# Patient Record
Sex: Female | Born: 1990 | Race: White | Hispanic: No | Marital: Married | Smoking: Never smoker
Health system: Southern US, Community
[De-identification: ages and names within clinical notes are randomized; demographics above are authoritative.]

## PROBLEM LIST (undated history)

## (undated) ENCOUNTER — Inpatient Hospital Stay (HOSPITAL_COMMUNITY): Payer: Self-pay

## (undated) DIAGNOSIS — R569 Unspecified convulsions: Secondary | ICD-10-CM

## (undated) DIAGNOSIS — D649 Anemia, unspecified: Secondary | ICD-10-CM

## (undated) DIAGNOSIS — K219 Gastro-esophageal reflux disease without esophagitis: Secondary | ICD-10-CM

## (undated) HISTORY — PX: WISDOM TOOTH EXTRACTION: SHX21

---
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
## 2011-07-13 ENCOUNTER — Other Ambulatory Visit: Payer: Self-pay | Admitting: Family Medicine

## 2011-07-13 DIAGNOSIS — R109 Unspecified abdominal pain: Secondary | ICD-10-CM

## 2011-07-21 ENCOUNTER — Ambulatory Visit
Admission: RE | Admit: 2011-07-21 | Discharge: 2011-07-21 | Disposition: A | Discharge status: Home / Self Care | Payer: 59 | Source: Ambulatory Visit | Attending: Family Medicine | Admitting: Family Medicine

## 2011-07-21 DIAGNOSIS — R109 Unspecified abdominal pain: Secondary | ICD-10-CM

## 2013-06-17 ENCOUNTER — Telehealth: Payer: Self-pay | Admitting: Neurology

## 2013-06-17 NOTE — Telephone Encounter (Signed)
I called patient. The patient was last seen to this office in 2012, with the last seizure in 2008. The patient was tapered off of her seizure medications, and she has done well. The patient is [redacted] weeks pregnant, and she is doing well. I would not alter any of her therapy at this point, the patient is to contact me only if she has a seizure event.

## 2013-07-18 ENCOUNTER — Inpatient Hospital Stay (HOSPITAL_COMMUNITY): Admission: AD | Admit: 2013-07-18 | Payer: Self-pay | Source: Ambulatory Visit | Admitting: Obstetrics and Gynecology

## 2014-04-08 ENCOUNTER — Other Ambulatory Visit: Payer: Self-pay | Admitting: Obstetrics and Gynecology

## 2014-04-08 DIAGNOSIS — N83201 Unspecified ovarian cyst, right side: Secondary | ICD-10-CM

## 2014-04-15 ENCOUNTER — Ambulatory Visit
Admission: RE | Admit: 2014-04-15 | Discharge: 2014-04-15 | Disposition: A | Discharge status: Home / Self Care | Payer: Commercial Managed Care - PPO | Source: Ambulatory Visit | Attending: Obstetrics and Gynecology | Admitting: Obstetrics and Gynecology

## 2014-04-15 DIAGNOSIS — N83201 Unspecified ovarian cyst, right side: Secondary | ICD-10-CM

## 2014-04-15 MED ORDER — GADOBENATE DIMEGLUMINE 529 MG/ML IV SOLN
10.0000 mL | Freq: Once | INTRAVENOUS | Status: AC | PRN
Start: 2014-04-15 — End: 2014-04-15

## 2014-06-06 ENCOUNTER — Other Ambulatory Visit: Payer: Self-pay | Admitting: Obstetrics and Gynecology

## 2014-06-18 ENCOUNTER — Other Ambulatory Visit: Payer: Self-pay | Admitting: Obstetrics and Gynecology

## 2014-06-21 ENCOUNTER — Other Ambulatory Visit (HOSPITAL_COMMUNITY): Payer: Self-pay | Admitting: Obstetrics and Gynecology

## 2014-06-21 NOTE — H&P (Signed)
Jennifer Knox is a 23 y.o.  female P 1-0-0-1 for removal of peritoneal mass.  During pregnancy in October  2014, the patient was found on ultrasound to have an asymptomatic right  pelvic mass (originally thought to be a cyst) that measured 9.8 x 6.8 x 10.0 cm with positive arterial & venous flow.  In May 2015 the mass had decreased to 6.68 x. 8.24 x 6.08 cm.  Patient remained asymptomatic and  due to breastfeeding she had occasional menses lasting for 5 days with a pad change every 3 hours.  Her cramping with periods is rated at 7/10 on a 10 point pain scale but she finds relief with Midol.  She goes on to deny any dyspareunia, changes in bowel or bladder function or vaginitis symptoms.  In August 2015 the patient had a normal CA-125 with a value of 10 and a  follow up pelvic ultrasound showed virtually no change in the pelvic mass.  A follow up MRI in September 2015 described the pelvic mass as being in the posterior right pelvis, well circumscribed with angulated margins measuring 7.9 x 7.6 x 8.2 cm without septations or nodular wall thickening.  Specifically the mass is located superior to the cervix, right of rectum and posterior to the  right ovary.  Given the persistent nature of this pelvic mass the patient has decided to proceed with removal for a  definitive diagnosis and managment.   Past Medical History  OB History: G: 1;  P: 1-0-0-1  SVB 2015  GYN History: menarche: 23 YO    LMP: 06/12/2014;  Contracepton condoms  The patient denies history of sexually transmitted disease.  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 Ascorbic Acid (VITAMIN C) 1000 MG tablet Take 1,000 mg by mouth daily.  . Ferrous Gluconate (IRON) 240 (27 FE) MG TABS Take 1 tablet by mouth daily.  . folic acid (FOLVITE) 220 MCG tablet Take 400 mcg by mouth daily.  Marland Kitchen ibuprofen (ADVIL,MOTRIN) 200 MG tablet Take 200-400 mg by mouth every 6 (six) hours as needed for headache.  . pantoprazole (PROTONIX) 40 MG tablet Take 40 mg by mouth daily.    Allergies  Allergen Reactions  . Penicillins Hives    Denies sensitivity to peanuts, shellfish, soy, latex or adhesives.  ROS: Admits to wearing contact lenses but  denies headache, vision changes, nasal congestion, dysphagia, tinnitus, dizziness, hoarseness, cough,  chest pain, shortness of breath, nausea, vomiting, diarrhea,constipation,  urinary frequency, urgency  dysuria, hematuria, vaginitis symptoms, pelvic pain, swelling of joints,easy bruising,  myalgias, arthralgias, skin rashes, unexplained weight loss and except as is mentioned in the history of present illness, patient's review of systems is otherwise negative.    Physical Exam  Bp: 84/56    P: 80    R: 18   Temperature: 97.0 degrees F orally      Weight: 110 lbs.  Height: 5\' 7"    BMI: 17.2  Neck: supple without masses or thyromegaly Lungs: clear to auscultation Heart: regular rate and rhythm Abdomen: soft, non-tender and no organomegaly Pelvic:EGBUS- wnl; vagina-normal rugae; uterus-normal size, cervix without lesions or motion tenderness; adnexae-no  tenderness or masses Extremities:  no clubbing, cyanosis or edema   Assesment: Peritoneal Mass   Disposition:  A discussion was held with patient regarding the indication for her procedure(s) along with the risks, which include but are not limited to: reaction to anesthesia, damage to adjacent organs, infection and excessive bleeding.  The patient verbalized understanding of these risks and has consented to proceed with Laparoscopic Removal of a Peritoneal Mass  with a Possible Laparotomy at [place] on July 01, 2014 at 7:30 a.m.   CSN# [id number]   Alix Stowers J. Florene Glen, PA-C  for Dr. Seymour Bars. Haygood

## 2014-06-26 NOTE — Patient Instructions (Addendum)
   Your procedure is scheduled on: Tuesday, Dec 15  Enter through the [employer] [facility] at: 6 AM Pick up the phone at the desk and dial [phone] and inform us of your arrival.  Please call this number if you have any problems the morning of surgery: [phone]  Remember: Do not eat or drink after midnight: Monday Take these medicines the morning of surgery with a SIP OF WATER: None   Do not wear jewelry, make-up, or FINGER nail polish No metal in your hair or on your body. Do not wear lotions, powders, perfumes.  You may wear deodorant.  Do not bring valuables to the hospital. Contacts, dentures or bridgework may not be worn into surgery.  Patients discharged on the day of surgery will not be allowed to drive home.  Home with husband Matt cell [phone].  For patients being admitted to the hospital, checkout time is 11:00am the day of discharge.

## 2014-06-27 ENCOUNTER — Encounter (HOSPITAL_COMMUNITY)
Admission: RE | Admit: 2014-06-27 | Discharge: 2014-06-27 | Disposition: A | Discharge status: Home / Self Care | Payer: Commercial Managed Care - PPO | Source: Ambulatory Visit | Attending: Obstetrics and Gynecology | Admitting: Obstetrics and Gynecology

## 2014-06-27 ENCOUNTER — Encounter (HOSPITAL_COMMUNITY): Payer: Self-pay

## 2014-06-27 DIAGNOSIS — Z01812 Encounter for preprocedural laboratory examination: Secondary | ICD-10-CM | POA: Diagnosis not present

## 2014-06-27 DIAGNOSIS — R1900 Intra-abdominal and pelvic swelling, mass and lump, unspecified site: Secondary | ICD-10-CM | POA: Diagnosis not present

## 2014-06-27 HISTORY — DX: Unspecified convulsions: R56.9

## 2014-06-27 HISTORY — DX: Anemia, unspecified: D64.9

## 2014-06-27 HISTORY — DX: Gastro-esophageal reflux disease without esophagitis: K21.9

## 2014-06-27 LAB — CBC
HEMATOCRIT: 38.6 % (ref 36.0–46.0)
Hemoglobin: 12.9 g/dL (ref 12.0–15.0)
MCH: 29.7 pg (ref 26.0–34.0)
MCHC: 33.4 g/dL (ref 30.0–36.0)
MCV: 88.9 fL (ref 78.0–100.0)
Platelets: 202 10*3/uL (ref 150–400)
RBC: 4.34 MIL/uL (ref 3.87–5.11)
RDW: 13.4 % (ref 11.5–15.5)
WBC: 7.8 10*3/uL (ref 4.0–10.5)

## 2014-06-30 ENCOUNTER — Encounter (HOSPITAL_COMMUNITY): Payer: Self-pay | Admitting: Anesthesiology

## 2014-06-30 NOTE — Anesthesia Preprocedure Evaluation (Addendum)
Anesthesia Evaluation  Patient identified by MRN, date of birth, ID band Patient awake    Reviewed: Allergy & Precautions, H&P , NPO status , Patient's Chart, lab work & pertinent test results  Airway Mallampati: II  TM Distance: >3 FB Neck ROM: Full    Dental  (+) Teeth Intact   Pulmonary neg pulmonary ROS,  breath sounds clear to auscultation  Pulmonary exam normal       Cardiovascular negative cardio ROS  Rhythm:Regular Rate:Normal     Neuro/Psych Seizures -, Well Controlled,  Last Sz 2008 Off Rx since 2012- used to take Zonegram negative psych ROS   GI/Hepatic Neg liver ROS, GERD-  Medicated and Controlled,  Endo/Other  negative endocrine ROS  Renal/GU negative Renal ROS  negative genitourinary   Musculoskeletal negative musculoskeletal ROS (+)   Abdominal   Peds  Hematology  (+) anemia ,   Anesthesia Other Findings   Reproductive/Obstetrics Peritoneal cyst                            Anesthesia Physical Anesthesia Plan  ASA: II  Anesthesia Plan: General   Post-op Pain Management:    Induction: Intravenous  Airway Management Planned: Oral ETT  Additional Equipment:   Intra-op Plan:   Post-operative Plan: Extubation in OR  Informed Consent: I have reviewed the patients History and Physical, chart, labs and discussed the procedure including the risks, benefits and alternatives for the proposed anesthesia with the patient or authorized representative who has indicated his/her understanding and acceptance.   Dental advisory given  Plan Discussed with: Anesthesiologist, CRNA and Surgeon  Anesthesia Plan Comments:         Anesthesia Quick Evaluation

## 2014-07-01 ENCOUNTER — Ambulatory Visit (HOSPITAL_COMMUNITY): Payer: Commercial Managed Care - PPO | Admitting: Anesthesiology

## 2014-07-01 ENCOUNTER — Encounter (HOSPITAL_COMMUNITY)
Admission: RE | Disposition: A | Discharge status: Home / Self Care | Payer: Self-pay | Source: Ambulatory Visit | Attending: Obstetrics and Gynecology

## 2014-07-01 ENCOUNTER — Ambulatory Visit (HOSPITAL_COMMUNITY)
Admission: RE | Admit: 2014-07-01 | Discharge: 2014-07-01 | Disposition: A | Discharge status: Home / Self Care | Payer: Commercial Managed Care - PPO | Source: Ambulatory Visit | Attending: Obstetrics and Gynecology | Admitting: Obstetrics and Gynecology

## 2014-07-01 DIAGNOSIS — D649 Anemia, unspecified: Secondary | ICD-10-CM | POA: Diagnosis not present

## 2014-07-01 DIAGNOSIS — N838 Other noninflammatory disorders of ovary, fallopian tube and broad ligament: Secondary | ICD-10-CM | POA: Diagnosis not present

## 2014-07-01 DIAGNOSIS — K219 Gastro-esophageal reflux disease without esophagitis: Secondary | ICD-10-CM | POA: Diagnosis not present

## 2014-07-01 DIAGNOSIS — K668 Other specified disorders of peritoneum: Secondary | ICD-10-CM

## 2014-07-01 DIAGNOSIS — R1900 Intra-abdominal and pelvic swelling, mass and lump, unspecified site: Secondary | ICD-10-CM | POA: Diagnosis present

## 2014-07-01 HISTORY — PX: LAPAROSCOPY: SHX197

## 2014-07-01 LAB — PREGNANCY, URINE: Preg Test, Ur: NEGATIVE

## 2014-07-01 SURGERY — LAPAROSCOPY OPERATIVE
Anesthesia: General | Site: Abdomen

## 2014-07-01 MED ORDER — ROCURONIUM BROMIDE 100 MG/10ML IV SOLN
INTRAVENOUS | Status: AC
  Filled 2014-07-01: qty 1

## 2014-07-01 MED ORDER — KETOROLAC TROMETHAMINE 30 MG/ML IJ SOLN
INTRAMUSCULAR | Status: DC | PRN
  Administered 2014-07-01: 30 mg via INTRAVENOUS
  Administered 2014-07-01: 30 mg via INTRAMUSCULAR

## 2014-07-01 MED ORDER — SCOPOLAMINE 1 MG/3DAYS TD PT72
1.0000 | MEDICATED_PATCH | Freq: Once | TRANSDERMAL | Status: DC
  Administered 2014-07-01: 1.5 mg via TRANSDERMAL

## 2014-07-01 MED ORDER — MEPERIDINE HCL 25 MG/ML IJ SOLN: 6.2500 mg | INTRAMUSCULAR | Status: DC | PRN

## 2014-07-01 MED ORDER — NEOSTIGMINE METHYLSULFATE 10 MG/10ML IV SOLN
INTRAVENOUS | Status: AC
  Filled 2014-07-01: qty 1

## 2014-07-01 MED ORDER — FENTANYL CITRATE 0.05 MG/ML IJ SOLN
INTRAMUSCULAR | Status: DC | PRN
  Administered 2014-07-01 (×3): 50 ug via INTRAVENOUS

## 2014-07-01 MED ORDER — NEOSTIGMINE METHYLSULFATE 10 MG/10ML IV SOLN
INTRAVENOUS | Status: DC | PRN
  Administered 2014-07-01: 2.5 mg via INTRAVENOUS

## 2014-07-01 MED ORDER — LACTATED RINGERS IV SOLN
INTRAVENOUS | Status: DC | PRN
  Administered 2014-07-01: 1000 mL

## 2014-07-01 MED ORDER — IBUPROFEN 600 MG PO TABS: ORAL_TABLET | ORAL | Status: DC

## 2014-07-01 MED ORDER — ROCURONIUM BROMIDE 100 MG/10ML IV SOLN
INTRAVENOUS | Status: DC | PRN
  Administered 2014-07-01: 30 mg via INTRAVENOUS
  Administered 2014-07-01: 5 mg via INTRAVENOUS

## 2014-07-01 MED ORDER — PROPOFOL 10 MG/ML IV BOLUS
INTRAVENOUS | Status: DC | PRN
  Administered 2014-07-01: 100 mg via INTRAVENOUS

## 2014-07-01 MED ORDER — BUPIVACAINE HCL (PF) 0.25 % IJ SOLN
INTRAMUSCULAR | Status: DC | PRN
  Administered 2014-07-01: 10 mL

## 2014-07-01 MED ORDER — PROPOFOL 10 MG/ML IV EMUL
INTRAVENOUS | Status: AC
  Filled 2014-07-01: qty 20

## 2014-07-01 MED ORDER — HYDROCODONE-ACETAMINOPHEN 5-325 MG PO TABS: ORAL_TABLET | ORAL | Status: DC

## 2014-07-01 MED ORDER — BUPIVACAINE HCL (PF) 0.25 % IJ SOLN
INTRAMUSCULAR | Status: AC
  Filled 2014-07-01: qty 30

## 2014-07-01 MED ORDER — MIDAZOLAM HCL 2 MG/2ML IJ SOLN
INTRAMUSCULAR | Status: AC
  Filled 2014-07-01: qty 2

## 2014-07-01 MED ORDER — GLYCOPYRROLATE 0.2 MG/ML IJ SOLN
INTRAMUSCULAR | Status: DC | PRN
  Administered 2014-07-01: .3 mg via INTRAVENOUS
  Administered 2014-07-01 (×2): 0.1 mg via INTRAVENOUS

## 2014-07-01 MED ORDER — MIDAZOLAM HCL 2 MG/2ML IJ SOLN
INTRAMUSCULAR | Status: DC | PRN
  Administered 2014-07-01: 0.5 mg via INTRAVENOUS
  Administered 2014-07-01: 1.5 mg via INTRAVENOUS

## 2014-07-01 MED ORDER — HYDROCODONE-ACETAMINOPHEN 5-325 MG PO TABS
1.0000 | ORAL_TABLET | Freq: Once | ORAL | Status: AC
  Administered 2014-07-01: 1 via ORAL

## 2014-07-01 MED ORDER — DEXAMETHASONE SODIUM PHOSPHATE 10 MG/ML IJ SOLN
INTRAMUSCULAR | Status: DC | PRN
  Administered 2014-07-01: 5 mg via INTRAVENOUS

## 2014-07-01 MED ORDER — FENTANYL CITRATE 0.05 MG/ML IJ SOLN
INTRAMUSCULAR | Status: AC
  Filled 2014-07-01: qty 5

## 2014-07-01 MED ORDER — GLYCOPYRROLATE 0.2 MG/ML IJ SOLN
INTRAMUSCULAR | Status: AC
  Filled 2014-07-01: qty 3

## 2014-07-01 MED ORDER — KETOROLAC TROMETHAMINE 30 MG/ML IJ SOLN
INTRAMUSCULAR | Status: AC
  Filled 2014-07-01: qty 1

## 2014-07-01 MED ORDER — HYDROCODONE-ACETAMINOPHEN 5-325 MG PO TABS
ORAL_TABLET | ORAL | Status: AC
  Filled 2014-07-01: qty 1

## 2014-07-01 MED ORDER — ONDANSETRON HCL 4 MG/2ML IJ SOLN
INTRAMUSCULAR | Status: AC
  Filled 2014-07-01: qty 2

## 2014-07-01 MED ORDER — LIDOCAINE HCL (CARDIAC) 20 MG/ML IV SOLN
INTRAVENOUS | Status: DC | PRN
  Administered 2014-07-01: 40 mg via INTRAVENOUS

## 2014-07-01 MED ORDER — METOCLOPRAMIDE HCL 5 MG/ML IJ SOLN: 10.0000 mg | Freq: Once | INTRAMUSCULAR | Status: DC | PRN

## 2014-07-01 MED ORDER — LACTATED RINGERS IV SOLN
INTRAVENOUS | Status: DC
  Administered 2014-07-01 (×2): via INTRAVENOUS

## 2014-07-01 MED ORDER — LIDOCAINE HCL (CARDIAC) 20 MG/ML IV SOLN
INTRAVENOUS | Status: AC
  Filled 2014-07-01: qty 5

## 2014-07-01 MED ORDER — DEXAMETHASONE SODIUM PHOSPHATE 10 MG/ML IJ SOLN
INTRAMUSCULAR | Status: AC
  Filled 2014-07-01: qty 1

## 2014-07-01 MED ORDER — SCOPOLAMINE 1 MG/3DAYS TD PT72
MEDICATED_PATCH | TRANSDERMAL | Status: DC
Start: 2014-07-01 — End: 2014-07-01
  Administered 2014-07-01: 1.5 mg via TRANSDERMAL
  Filled 2014-07-01: qty 1

## 2014-07-01 MED ORDER — FENTANYL CITRATE 0.05 MG/ML IJ SOLN: 25.0000 ug | INTRAMUSCULAR | Status: DC | PRN

## 2014-07-01 MED ORDER — ONDANSETRON HCL 4 MG/2ML IJ SOLN
INTRAMUSCULAR | Status: DC | PRN
Start: 2014-07-01 — End: 2014-07-01
  Administered 2014-07-01 (×2): 2 mg via INTRAVENOUS

## 2014-07-01 MED ORDER — HEPARIN SODIUM (PORCINE) 5000 UNIT/ML IJ SOLN
INTRAMUSCULAR | Status: AC
  Filled 2014-07-01: qty 1

## 2014-07-01 SURGICAL SUPPLY — 56 items
BARRIER ADHS 3X4 INTERCEED (GAUZE/BANDAGES/DRESSINGS) IMPLANT
BLADE SURG 10 STRL SS (BLADE) IMPLANT
BLADE SURG 11 STRL SS (BLADE) ×3 IMPLANT
CABLE HIGH FREQUENCY MONO STRZ (ELECTRODE) IMPLANT
CANISTER SUCT 3000ML (MISCELLANEOUS) ×3 IMPLANT
CHLORAPREP W/TINT 26ML (MISCELLANEOUS) ×3 IMPLANT
CLOTH BEACON ORANGE TIMEOUT ST (SAFETY) ×3 IMPLANT
CONTAINER PREFILL 10% NBF 15ML (MISCELLANEOUS) IMPLANT
CONTAINER PREFILL 10% NBF 60ML (FORM) IMPLANT
DECANTER SPIKE VIAL GLASS SM (MISCELLANEOUS) IMPLANT
DRAPE WARM FLUID 44X44 (DRAPE) IMPLANT
DRSG COVADERM PLUS 2X2 (GAUZE/BANDAGES/DRESSINGS) ×6 IMPLANT
DRSG OPSITE POSTOP 3X4 (GAUZE/BANDAGES/DRESSINGS) ×3 IMPLANT
DRSG TELFA 3X8 NADH (GAUZE/BANDAGES/DRESSINGS) IMPLANT
ELECT CAUTERY BLADE 6.4 (BLADE) IMPLANT
GAUZE SPONGE 4X4 16PLY XRAY LF (GAUZE/BANDAGES/DRESSINGS) ×3 IMPLANT
GLOVE SURG SS PI 6.5 STRL IVOR (GLOVE) ×12 IMPLANT
GOWN STRL REUS W/TWL LRG LVL3 (GOWN DISPOSABLE) ×9 IMPLANT
LIQUID BAND (GAUZE/BANDAGES/DRESSINGS) ×3 IMPLANT
NEEDLE HYPO 25X1 1.5 SAFETY (NEEDLE) ×3 IMPLANT
NS IRRIG 1000ML POUR BTL (IV SOLUTION) IMPLANT
PACK ABDOMINAL GYN (CUSTOM PROCEDURE TRAY) ×3 IMPLANT
PACK LAPAROSCOPY BASIN (CUSTOM PROCEDURE TRAY) ×3 IMPLANT
PAD OB MATERNITY 4.3X12.25 (PERSONAL CARE ITEMS) ×3 IMPLANT
PAD TRENDELENBURG OR TABLE (MISCELLANEOUS) ×3 IMPLANT
POUCH SPECIMEN RETRIEVAL 10MM (ENDOMECHANICALS) ×3 IMPLANT
PROTECTOR NERVE ULNAR (MISCELLANEOUS) ×3 IMPLANT
SET IRRIG TUBING LAPAROSCOPIC (IRRIGATION / IRRIGATOR) ×3 IMPLANT
SHEARS HARMONIC ACE PLUS 36CM (ENDOMECHANICALS) ×3 IMPLANT
SPONGE LAP 18X18 X RAY DECT (DISPOSABLE) ×6 IMPLANT
STAPLER VISISTAT 35W (STAPLE) IMPLANT
STRIP CLOSURE SKIN 1/4X3 (GAUZE/BANDAGES/DRESSINGS) IMPLANT
SUT CHROMIC 3 0 SH 27 (SUTURE) IMPLANT
SUT MNCRL AB 3-0 PS2 27 (SUTURE) IMPLANT
SUT MNCRL AB 4-0 PS2 18 (SUTURE) ×3 IMPLANT
SUT PDS AB 0 CT 36 (SUTURE) IMPLANT
SUT PLAIN 2 0 XLH (SUTURE) IMPLANT
SUT VIC AB 0 CT1 18XCR BRD8 (SUTURE) IMPLANT
SUT VIC AB 0 CT1 27 (SUTURE) ×2
SUT VIC AB 0 CT1 27XBRD ANBCTR (SUTURE) ×4 IMPLANT
SUT VIC AB 0 CT1 8-18 (SUTURE)
SUT VIC AB 3-0 CT1 27 (SUTURE) ×1
SUT VIC AB 3-0 CT1 TAPERPNT 27 (SUTURE) ×2 IMPLANT
SUT VIC AB 3-0 PS2 18 (SUTURE)
SUT VIC AB 3-0 PS2 18XBRD (SUTURE) IMPLANT
SUT VICRYL 0 ENDOLOOP (SUTURE) IMPLANT
SUT VICRYL 0 TIES 12 18 (SUTURE) ×3 IMPLANT
SUT VICRYL 0 UR6 27IN ABS (SUTURE) ×6 IMPLANT
SYR 50ML LL SCALE MARK (SYRINGE) ×3 IMPLANT
SYR CONTROL 10ML LL (SYRINGE) ×3 IMPLANT
TOWEL OR 17X24 6PK STRL BLUE (TOWEL DISPOSABLE) ×6 IMPLANT
TRAY FOLEY CATH 14FR (SET/KITS/TRAYS/PACK) ×3 IMPLANT
TROCAR BALL TOP DISP 5MM (ENDOMECHANICALS) ×6 IMPLANT
TROCAR XCEL DIL TIP R 11M (ENDOMECHANICALS) ×3 IMPLANT
WARMER LAPAROSCOPE (MISCELLANEOUS) ×3 IMPLANT
WATER STERILE IRR 1000ML POUR (IV SOLUTION) ×3 IMPLANT

## 2014-07-01 NOTE — Op Note (Signed)
Diagnostic Laparoscopy Procedure Note  Indications: The patient is a 23 y.o. female with   Pre-operative Diagnosis: Persistent right adnexal mass, cystic  Post-operative Diagnosis:right paratubal cyst  Surgeon: Eldred Manges   Assistants: Earnstine Regal PA-C  Anesthesia: General endotracheal anesthesia  ASA Class: 1  Procedure Details  The patient was seen in the Holding Room. The risks, benefits, complications, treatment options, and expected outcomes were discussed with the patient. The possibilities of reaction to medication, pulmonary aspiration, perforation of viscus, bleeding, recurrent infection, the need for additional procedures, failure to diagnose a condition, and creating a complication requiring transfusion or operation were discussed with the patient. The patient concurred with the proposed plan, giving informed consent. The patient was taken to the Operating Room, identified as [name] and the procedure verified as Diagnostic Laparoscopy with removal of peritoneal cyst with the possible need for laparotomy for cyst removal. A Time Out was held and the above information confirmed.  After induction of general anesthesia, the patient was placed in modified dorsal lithotomy position where she was prepped, draped, and catheterized with a foley in the normal, sterile fashion. The cervix was visualized and an intrauterine manipulator was placed.  Subcutaneous injection of quarter percent Marcaine was undertaken in the subumbilical and suprapubic regions were total of 10 cc. A 11BJ umbilical incision was then performed and that incision carried sharply into the peritoneum. The Hassan cannula was placed into the peritoneal cavity and secured with sutures of 0 Vicryl which had been placed through the fascia and peritoneal tissues. The laparoscope was then placed through the [facility] cannula. Suprapubic incisions were made to the right and left of midline, and laparoscopic probe  trochars placed through those incisions, a 5 mm trocar on the left and a 10 mm trocar on the right. The  findings were noted and documented.  The right paratubal cyst was elevated into the operative field with atraumatic graspers and the Harmonic Ace mechanism used to incise the overlying peritoneum. Care was taken to incise the antimesenteric areas covering the tube to leave the tube intact and not traumatized. Hydrodissection was used to aid in definition of the plane that allowed the peritoneal cyst to be extracted from its overlying peritoneal covering with a combination of blunt and sharp dissection. During this dissection the cyst ruptured admitting clear serous fluid. The dissection was taken down to the last areas of covering peritoneum and w was then excised sharply from the covering peritoneum and brought through the suprapubic trocar. Hemostasis was achieved in the remaining mesenteric tissues with the Harmonic Ace and copious irrigation carried out to document adequate hemostasis. Approximately 50 cc of warm lactated Ringer's was left in the peritoneal cavity.  Following the procedure the umbilical sheath and suprapubic sheaths were removed under direct visualization as the intra-abdominal carbon dioxide was expressed. The umbilical incision was closed with fascial sutures of 0 Vicryl in a figure-of-eight fashion. Subcuticular sutures of 4-0 Vicryl were used to close the subumbilical scan. The suprapubic incisions were closed with Dermabond. The intrauterine manipulator was then removed.  Instrument, sponge, and needle counts were correct prior to abdominal closure and at the conclusion of the case. Patient was awakened from general anesthesia and taken to the recovery room in satisfactory condition having tolerated the procedure well sponge and instrument counts correct.  Findings: The anterior cul-de-sac and round ligaments: no lesions noted. The uterus : Normal sized, without any lesions  noted The adnexa:  Left adnexum contained a normal tube and ovary.  There were no adhesions or evidence of endometriosis. The right adnexum contained what appeared to be a normal ovary and a 7 cm paratubal cyst in the mesenteric space of the broad ligament. Cul-de-sac :  No lesions were noted. There were no excrescences or nodules.  Estimated Blood Loss:  Minimal         Drains: None                Specimens: Right paratubal cyst.  Sent to pathology             Complications:  None; patient tolerated the procedure well.         Disposition: PACU - hemodynamically stable.  the patient will be discharged home after meeting all postanesthesia benchmarks.         Condition: stable

## 2014-07-01 NOTE — Anesthesia Postprocedure Evaluation (Signed)
  Anesthesia Post-op Note  Anesthesia Post Note  Patient: Jennifer Knox  Procedure(s) Performed: Procedure(s) (LRB): LAPAROSCOPY OPERATIVE for removal of Peritoneal Cyst  (N/A) EXPLORATORY LAPAROTOMY (N/A)  Anesthesia type: General  Patient location: PACU  Post pain: Pain level controlled  Post assessment: Post-op Vital signs reviewed  Last Vitals:  Filed Vitals:   07/01/14 1000  BP:   Pulse: 60  Temp:   Resp: 13    Post vital signs: Reviewed  Level of consciousness: sedated  Complications: No apparent anesthesia complications

## 2014-07-01 NOTE — H&P (Signed)
  History and Physical Interval Note:   07/01/2014   7:15 AM   Jennifer Knox  has presented today for surgery, with the diagnosis of Peritoneal Cyst  The various methods of treatment have been discussed with the patient and family. After consideration of risks, benefits and other options for treatment, the patient has consented to  Procedure(s): LAPAROSCOPY OPERATIVE for removal of Peritoneal Cyst with the possibility of EXPLORATORY LAPAROTOMY as a surgical intervention .  I have examined the pt, reviewed the patients' chart and labs.  Questions were answered to the patient's satisfaction.     Eldred Manges  MD

## 2014-07-01 NOTE — Discharge Instructions (Signed)
Call [facility] @ [phone] if:  You have a temperature greater than or equal to 100.4 degrees Farenheit orally You have pain that is not made better by the pain medication given and taken as directed  Take Colace (Docusate Sodium/Stool Softener) 100 mg 2-3 times daily while taking narcotic pain medicine to avoid constipation or until bowel movements are regular.  You may drive after 48 hours You may walk up steps You may shower tomorrow  You may resume a regular diet Keep incisions clean and dry  Do not lift over 15 pounds for 6 weeks Avoid anything in vagina  until after your post-operative visit  Keep follow up appointment with Dr. Leo Grosser on July 23, 2014 at 8:45 a.m.

## 2014-07-01 NOTE — Transfer of Care (Signed)
Immediate Anesthesia Transfer of Care Note  Patient: Jennifer Knox  Procedure(s) Performed: Procedure(s): LAPAROSCOPY OPERATIVE for removal of Peritoneal Cyst  (N/A) EXPLORATORY LAPAROTOMY (N/A)  Patient Location: PACU  Anesthesia Type:General  Level of Consciousness: awake, alert  and oriented  Airway & Oxygen Therapy: Patient Spontanous Breathing and Patient connected to nasal cannula oxygen  Post-op Assessment: Report given to PACU RN, Post -op Vital signs reviewed and stable and Patient moving all extremities X 4  Post vital signs: Reviewed and stable  Complications: No apparent anesthesia complications

## 2014-07-02 ENCOUNTER — Encounter (HOSPITAL_COMMUNITY): Payer: Self-pay | Admitting: Obstetrics and Gynecology

## 2015-03-31 LAB — OB RESULTS CONSOLE ANTIBODY SCREEN: Antibody Screen: NEGATIVE

## 2015-03-31 LAB — OB RESULTS CONSOLE ABO/RH: RH TYPE: NEGATIVE

## 2015-03-31 LAB — OB RESULTS CONSOLE RUBELLA ANTIBODY, IGM: Rubella: IMMUNE

## 2015-03-31 LAB — OB RESULTS CONSOLE HIV ANTIBODY (ROUTINE TESTING): HIV: NONREACTIVE

## 2015-03-31 LAB — OB RESULTS CONSOLE HEPATITIS B SURFACE ANTIGEN: HEP B S AG: NEGATIVE

## 2015-03-31 LAB — OB RESULTS CONSOLE RPR: RPR: NONREACTIVE

## 2015-07-19 NOTE — L&D Delivery Note (Signed)
  Vaginal Delivery Note The pt utilized movement, breathing and coping skills as pain management.   Spontaneous rupture of membranes today, at 0013, clear.  GBS was negative.  Cervical dilation was complete at 0013 .  NICHD Category 1.    Spontaneous pushing on hands and knees began at  0014.   After 5 minutes of pushing the head delivered and with maternal repositioning on her back, the shoulders and the body of a viable girl infant "Ryleigh" delivered spontaneously with maternal effort in the LOA position at 0019.     With vigorous tone and spontaneous cry, the infant was placed on moms abd.  After the umbilical cord was clamped it was cut by the FOB, then cord blood was obtained for evaluation.  Spontaneous delivery of a intact placenta with a 3 vessel cord via [place] at 0027 .   Episiotomy: None   The vulva, perineum, vaginal vault, rectum and cervix were inspected  and revealed a 2nd degree perineal, repaired using a 3-0 vicryl on a CT needle  needle with 8 ml of 1% lidocaine.The rectum sphincter intact after the repair. Patient tolerated repair well.   Postpartum pitocin as ordered.  Fundus firm, lochia minimum, bleeding under control.  EBL 150, Pt hemodynamically stable.   Sponge, laps and needle count correct and verified with the primary care nurse.  Attending MD available at all times.    Routine postpartum orders  Mother desires Natural Family Planning and Condoms for contraception Mom plans to breastfeed   Placenta to pathology: NO     Cord Gases sent to lab: NO Cord blood sent to lab: YES   APGARS:  9 at 1 minute and 9 at 5 minutes Weight:. TBD   Both mom and baby were left in stable condition, baby skin to skin.   Jennifer Knox, CNM, MSN 11/21/2015. 1:13 AM

## 2015-10-22 LAB — OB RESULTS CONSOLE GBS: STREP GROUP B AG: NEGATIVE

## 2015-11-05 ENCOUNTER — Inpatient Hospital Stay (HOSPITAL_COMMUNITY)
Admission: AD | Admit: 2015-11-05 | Discharge: 2015-11-05 | Disposition: A | Discharge status: Home / Self Care | Payer: Commercial Managed Care - PPO | Source: Ambulatory Visit | Attending: Obstetrics and Gynecology | Admitting: Obstetrics and Gynecology

## 2015-11-05 ENCOUNTER — Encounter (HOSPITAL_COMMUNITY): Payer: Self-pay | Admitting: *Deleted

## 2015-11-05 DIAGNOSIS — Z6791 Unspecified blood type, Rh negative: Secondary | ICD-10-CM

## 2015-11-05 DIAGNOSIS — K219 Gastro-esophageal reflux disease without esophagitis: Secondary | ICD-10-CM | POA: Insufficient documentation

## 2015-11-05 DIAGNOSIS — O479 False labor, unspecified: Secondary | ICD-10-CM

## 2015-11-05 DIAGNOSIS — Z3A37 37 weeks gestation of pregnancy: Secondary | ICD-10-CM | POA: Insufficient documentation

## 2015-11-05 DIAGNOSIS — Z8669 Personal history of other diseases of the nervous system and sense organs: Secondary | ICD-10-CM

## 2015-11-05 DIAGNOSIS — O471 False labor at or after 37 completed weeks of gestation: Secondary | ICD-10-CM | POA: Insufficient documentation

## 2015-11-05 DIAGNOSIS — O26899 Other specified pregnancy related conditions, unspecified trimester: Secondary | ICD-10-CM

## 2015-11-05 DIAGNOSIS — Z880 Allergy status to penicillin: Secondary | ICD-10-CM | POA: Insufficient documentation

## 2015-11-05 NOTE — MAU Note (Signed)
Pt reprots ctx starting around 6:30pm. Denies leaking or bleeding. Ctx 7-10 min. +Fm

## 2015-11-05 NOTE — Discharge Instructions (Signed)
Braxton Hicks Contractions °Contractions of the uterus can occur throughout pregnancy. Contractions are not always a sign that you are in labor.  °WHAT ARE BRAXTON HICKS CONTRACTIONS?  °Contractions that occur before labor are called Braxton Hicks contractions, or false labor. Toward the end of pregnancy (32-34 weeks), these contractions can develop more often and may become more forceful. This is not true labor because these contractions do not result in opening (dilatation) and thinning of the cervix. They are sometimes difficult to tell apart from true labor because these contractions can be forceful and people have different pain tolerances. You should not feel embarrassed if you go to the hospital with false labor. Sometimes, the only way to tell if you are in true labor is for your health care provider to look for changes in the cervix. °If there are no prenatal problems or other health problems associated with the pregnancy, it is completely safe to be sent home with false labor and await the onset of true labor. °HOW CAN YOU TELL THE DIFFERENCE BETWEEN TRUE AND FALSE LABOR? °False Labor °· The contractions of false labor are usually shorter and not as hard as those of true labor.   °· The contractions are usually irregular.   °· The contractions are often felt in the front of the lower abdomen and in the groin.   °· The contractions may go away when you walk around or change positions while lying down.   °· The contractions get weaker and are shorter lasting as time goes on.   °· The contractions do not usually become progressively stronger, regular, and closer together as with true labor.   °True Labor °· Contractions in true labor last 30-70 seconds, become very regular, usually become more intense, and increase in frequency.   °· The contractions do not go away with walking.   °· The discomfort is usually felt in the top of the uterus and spreads to the lower abdomen and low back.   °· True labor can be  determined by your health care provider with an exam. This will show that the cervix is dilating and getting thinner.   °WHAT TO REMEMBER °· Keep up with your usual exercises and follow other instructions given by your health care provider.   °· Take medicines as directed by your health care provider.   °· Keep your regular prenatal appointments.   °· Eat and drink lightly if you think you are going into labor.   °· If Braxton Hicks contractions are making you uncomfortable:   °¨ Change your position from lying down or resting to walking, or from walking to resting.   °¨ Sit and rest in a tub of warm water.   °¨ Drink 2-3 glasses of water. Dehydration may cause these contractions.   °¨ Do slow and deep breathing several times an hour.   °WHEN SHOULD I SEEK IMMEDIATE MEDICAL CARE? °Seek immediate medical care if: °· Your contractions become stronger, more regular, and closer together.   °· You have fluid leaking or gushing from your vagina.   °· You have a fever.   °· You pass blood-tinged mucus.   °· You have vaginal bleeding.   °· You have continuous abdominal pain.   °· You have low back pain that you never had before.   °· You feel your baby's head pushing down and causing pelvic pressure.   °· Your baby is not moving as much as it used to.   °  °This information is not intended to replace advice given to you by your health care provider. Make sure you discuss any questions you have with your health care   provider. °  °Document Released: 07/04/2005 Document Revised: 07/09/2013 Document Reviewed: 04/15/2013 °Elsevier Interactive Patient Education ©2016 [employer] ° °

## 2015-11-05 NOTE — MAU Provider Note (Signed)
  History  Jennifer Knox is a 25 yo G2P1001 @ 37.6 wks who presents to MAU w/ c/o ctxs q 5-8 minutes x 1 hr 10 min. Active fetus. Denies leaking or bleeding. Reports h/o rapid delivery w/ first baby.  Reports decrease in ctxs once settled in MAU.  Patient Active Problem List   Diagnosis Date Noted  . Penicillin allergy (hives) 11/05/2015  . History of seizure disorder (no seizures since 2008; no meds since 2012) 11/05/2015  . Rh negative, maternal 11/05/2015  . GERD (gastroesophageal reflux disease) 11/05/2015  . Pelvic mass in female 07/01/2014  . Paratubal cyst 07/01/2014    Chief Complaint  Patient presents with  . Labor Eval   HPI  As above  OB History    Gravida Para Term Preterm AB TAB SAB Ectopic Multiple Living   2 1 1       1       Past Medical History  Diagnosis Date  . Anemia   . GERD (gastroesophageal reflux disease)   . SVD (spontaneous vaginal delivery) 08/2013    x 1 [place]  . Seizures ([place]) 2006    No seizures since 2008, triggers:lack if sleep, no meds since 2012    Past Surgical History  Procedure Laterality Date  . Wisdom tooth extraction    . Laparoscopy N/A 07/01/2014    Procedure: LAPAROSCOPY OPERATIVE With paratubal cystectomy;  Surgeon: Eldred Manges, MD;  Location: [facility];  Service: Gynecology;  Laterality: N/A;    History reviewed. No pertinent family history.  Social History  Substance Use Topics  . Smoking status: Never Smoker   . Smokeless tobacco: Never Used  . Alcohol Use: No    Allergies:  Allergies  Allergen Reactions  . Penicillins Hives    Has patient had a PCN reaction causing immediate rash, facial/tongue/throat swelling, SOB or lightheadedness with hypotension: unknown Has patient had a PCN reaction causing severe rash involving mucus membranes or skin necrosis: unknown Has patient had a PCN reaction that required hospitalization no Has patient had a PCN reaction occurring within the last 10 years: no If all of  the above answers are "NO", then may proceed with Cephalosporin use.     No prescriptions prior to admission    ROS  Per HPI Physical Exam   Blood pressure 118/74, pulse 71, temperature 98.8 F (37.1 C), temperature source Oral, resp. rate 18.    Physical Exam  Gen: NAD. Abdomen: gravid, soft, NT, no rebound or guarding. Pelvic: 0/80/+1 (no change from exam earlier this month), cephalic by Leop[place] and VE. Ext: WNL. FHRT: BL 135 w/ moderate variability, +accels, no decels. 1 ctx noted.  ED Course  Assessment: Cat 1 FHRT False labor  Plan: Strict labor precautions. Continue FKCs per protocol. OB f/u as scheduled.   Farrel Gordon CNM, MS 11/05/2015 10:24 PM

## 2015-11-20 ENCOUNTER — Encounter (HOSPITAL_COMMUNITY): Payer: Self-pay | Admitting: *Deleted

## 2015-11-20 ENCOUNTER — Inpatient Hospital Stay (HOSPITAL_COMMUNITY)
Admission: AD | Admit: 2015-11-20 | Discharge: 2015-11-22 | DRG: 775 | Disposition: A | Discharge status: Home / Self Care | Payer: Commercial Managed Care - PPO | Source: Ambulatory Visit | Attending: Obstetrics & Gynecology | Admitting: Obstetrics & Gynecology

## 2015-11-20 DIAGNOSIS — O9962 Diseases of the digestive system complicating childbirth: Secondary | ICD-10-CM | POA: Diagnosis present

## 2015-11-20 DIAGNOSIS — Z3A40 40 weeks gestation of pregnancy: Secondary | ICD-10-CM

## 2015-11-20 DIAGNOSIS — Z6791 Unspecified blood type, Rh negative: Secondary | ICD-10-CM | POA: Diagnosis present

## 2015-11-20 DIAGNOSIS — Z8669 Personal history of other diseases of the nervous system and sense organs: Secondary | ICD-10-CM

## 2015-11-20 DIAGNOSIS — K219 Gastro-esophageal reflux disease without esophagitis: Secondary | ICD-10-CM | POA: Diagnosis present

## 2015-11-20 DIAGNOSIS — Z880 Allergy status to penicillin: Secondary | ICD-10-CM

## 2015-11-20 DIAGNOSIS — O26899 Other specified pregnancy related conditions, unspecified trimester: Secondary | ICD-10-CM

## 2015-11-20 DIAGNOSIS — O360930 Maternal care for other rhesus isoimmunization, third trimester, not applicable or unspecified: Principal | ICD-10-CM | POA: Diagnosis present

## 2015-11-20 LAB — CBC
HCT: 38.1 % (ref 36.0–46.0)
Hemoglobin: 13.2 g/dL (ref 12.0–15.0)
MCH: 30.8 pg (ref 26.0–34.0)
MCHC: 34.6 g/dL (ref 30.0–36.0)
MCV: 89 fL (ref 78.0–100.0)
PLATELETS: 202 10*3/uL (ref 150–400)
RBC: 4.28 MIL/uL (ref 3.87–5.11)
RDW: 14.6 % (ref 11.5–15.5)
WBC: 9.9 10*3/uL (ref 4.0–10.5)

## 2015-11-20 MED ORDER — ACETAMINOPHEN 325 MG PO TABS: 650.0000 mg | ORAL_TABLET | ORAL | Status: DC | PRN

## 2015-11-20 MED ORDER — LIDOCAINE HCL (PF) 1 % IJ SOLN
30.0000 mL | INTRAMUSCULAR | Status: DC | PRN
  Administered 2015-11-21: 30 mL via SUBCUTANEOUS
  Filled 2015-11-20: qty 30

## 2015-11-20 MED ORDER — OXYCODONE-ACETAMINOPHEN 5-325 MG PO TABS: 2.0000 | ORAL_TABLET | ORAL | Status: DC | PRN

## 2015-11-20 MED ORDER — CITRIC ACID-SODIUM CITRATE 334-500 MG/5ML PO SOLN: 30.0000 mL | ORAL | Status: DC | PRN

## 2015-11-20 MED ORDER — OXYTOCIN 10 UNIT/ML IJ SOLN
INTRAMUSCULAR | Status: AC
  Filled 2015-11-20: qty 1

## 2015-11-20 MED ORDER — LACTATED RINGERS IV SOLN: INTRAVENOUS | Status: DC

## 2015-11-20 MED ORDER — ONDANSETRON HCL 4 MG/2ML IJ SOLN: 4.0000 mg | Freq: Four times a day (QID) | INTRAMUSCULAR | Status: DC | PRN

## 2015-11-20 MED ORDER — FLEET ENEMA 7-19 GM/118ML RE ENEM: 1.0000 | ENEMA | RECTAL | Status: DC | PRN

## 2015-11-20 MED ORDER — OXYCODONE-ACETAMINOPHEN 5-325 MG PO TABS: 1.0000 | ORAL_TABLET | ORAL | Status: DC | PRN

## 2015-11-20 MED ORDER — LACTATED RINGERS IV SOLN: 500.0000 mL | INTRAVENOUS | Status: DC | PRN

## 2015-11-20 MED ORDER — OXYTOCIN BOLUS FROM INFUSION: 500.0000 mL | INTRAVENOUS | Status: DC

## 2015-11-20 MED ORDER — OXYTOCIN 10 UNIT/ML IJ SOLN
2.5000 [IU]/h | INTRAVENOUS | Status: DC
  Administered 2015-11-21: 39.96 [IU]/h via INTRAVENOUS
  Filled 2015-11-20: qty 4

## 2015-11-20 NOTE — MAU Note (Signed)
Pt repots she has been having ctx q 5 min since 7:30 pm. Denies any vag bleeding or leaking. +FM reported.

## 2015-11-20 NOTE — H&P (Signed)
Jennifer Knox is a 25 y.o. female, G2P1001 at 40.0 weeks, presenting for labor.   Pt reports contractions started at 7pm this evening and have progressively gotten stronger and closer together.  +Fm,  denies vaginal bleeding or leaking or fluid.   Reports history of quick labor. States her first labor was six hours.   Desires natural, unmedicated delivery  Patient Active Problem List   Diagnosis Date Noted  . Normal labor 11/20/2015  . Penicillin allergy (hives) 11/05/2015  . History of seizure disorder (no seizures since 2008; no meds since 2012) 11/05/2015  . Rh negative, maternal 11/05/2015  . GERD (gastroesophageal reflux disease) 11/05/2015  . Pelvic mass in female 07/01/2014  . Paratubal cyst 07/01/2014    History of present pregnancy: Patient entered care at 6.4 weeks.    EDC of 11/20/15 was established by LMP of 02/13/15.    Anatomy scan:  20.5 weeks on 07/07/16, with normal findings and an posterior placenta.    Singleton pregnancy. TRV HMR presentation. Cervix closed - measured transabdominally 4.2 cm. Posterior  placenta. Placenta edge 2.0 cm from internal os. Fluid is normal. CP, THAL, NB, profile, orbits,palate,  diaphragm - seen. Spine, kidneys/renal arts, RVOT, LVOT, DA 3VV, not well visualized due to fetal postion.  [place]/ovaries unremarkable Additional [place] evaluations:  24.6 wks on 08/06/15:  [place]: SIUP, BREECH, posterior placenta, NORMAL FLUID, ANATOMY NOW COMPLETE,  FEMALE, CX 5.70 CM, GROWTH 12.7%.   Placental edge is now 3.8 cm from internal os- low lying placenta  now resolved.  29.6 wks  On 09/10/15:  N[place] pregnancy. Vertex presentation. Posterior placenta. AFI is normal 35th%, Cervix  closed. Adnexas unremarkable. 33.6 wks on 10/08/15: U/S: Singleton pregnancy. Vertex presentation. Posterior placenta. AFI normal = 50th%. Cervix  closed Adnexas unremarkable. EFW. 4 lbs 12 oz. 28% 38.4 wks on 10/27/15:   SIUP, vertex, cervix not seen per protocol, posterior placenta, AFI  =40%,   [id number], 37th% 40 wks on 5/11/17/15:   SIUP, vertex, cervix not seen per protocol, posterior placenta,  AFI =55%, BPP  8/8  In 75min Significant prenatal events:  First Trimester:    Rh negative,  Declined Genetic testing Second Trimester:   Linear growth, anatomy wnl,  Low lying placenta is resolved Third Trimester :   C/o  Cramping and lower pelvic pressure beginning at  28wks,   Rhogam at 30 wks  Last evaluation:  39.5 wks on 11/18/15   By S. DevaneJohnson.   FHR 140bpm, FH 37cm,  Vertex,    BP 84/68  wtg 135lbs  OB History    Gravida Para Term Preterm AB TAB SAB Ectopic Multiple Living   2 1 1       1      09/07/2013, 39.1 wks, M, 8lbs 6oz, NSVD   Past Medical History  Diagnosis Date  . Anemia   . GERD (gastroesophageal reflux disease)   . SVD (spontaneous vaginal delivery) 08/2013    [place]  . Seizures ([place]) 2006    No seizures since 2008, triggers:lack if sleep, no meds since 2012   Past Surgical History  Procedure Laterality Date  . Wisdom tooth extraction    . Laparoscopy N/A 07/01/2014    Procedure: LAPAROSCOPY OPERATIVE With paratubal cystectomy;  Surgeon: Eldred Manges, MD;  Location: [facility];  Service: Gynecology;  Laterality: N/A;   Family History: family history is not on file. Social History:  reports that she has never smoked. She has never used smokeless tobacco. She reports  that she does not drink alcohol or use illicit drugs.  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   Prenatal Transfer Tool  Maternal Diabetes: No Genetic Screening: Declined Maternal Ultrasounds/Referrals: Normal Fetal Ultrasounds or other Referrals:  None Maternal Substance Abuse:  No Significant Maternal Medications:  None Significant Maternal Lab Results: Lab values include: Group B Strep negative  TDAP   09/24/15 Flu none Rhogam on   09/10/15  ROS:  All systems  negative except as report in HPI  Allergies  Allergen Reactions  . Penicillins Hives    Has patient had a PCN reaction causing immediate rash, facial/tongue/throat swelling, SOB or lightheadedness with hypotension: unknown Has patient had a PCN reaction causing severe rash involving mucus membranes or skin necrosis: unknown Has patient had a PCN reaction that required hospitalization no Has patient had a PCN reaction occurring within the last 10 years: no If all of the above answers are "NO", then may proceed with Cephalosporin use.      Dilation: 8 Exam by:: Lavetta Nielsen Blood pressure 127/77, pulse 92, temperature 98.5 F (36.9 C), temperature source Oral, resp. rate 20, height 5\' 7"  (1.702 m), weight 59.421 kg (131 lb).  Chest clear Heart RRR without murmur Abd gravid, NT, FH 37 Pelvic: proven to 8lbs 6oz Ext: wnl  FHR: Category 1,m 135 bpm, moderate variability, +accels, no decels UCs:  Regular, every 2-3 minutes, palpate mild-strong  Prenatal labs: ABO, Rh: O/Negative/-- (09/13 0000) Antibody: Negative (09/13 0000) Rubella:  !Error!    immune RPR: Nonreactive (09/13 0000)  HBsAg: Negative (09/13 0000)  HIV: Non-reactive (09/13 0000)  GBS: Negative (04/06 0000) Sickle cell/Hgb electrophoresis:  n/a Pap:  Wnl, 10/2014 GC:  Not on file, pending Chlamydia: Not on file, pending Genetic screenings:  declined Glucola: wnl Other:     Hgb 13.3  at NOB, 11.9 at 28 weeks   Assessment/Plan: IUP at 40.0  Active labor Rh negative GC/CH labs not listed on file Penicillin allergy GERD GBS negative Cat 1 tracing   Plan: Admit to [facility] per consult with Dr. Alesia Richards Routine [facility] orders Pain med/epidural prn Intermittent fetal monitoring monitoring for Cat 1 tracing  Expectant management Anticipate SVD   Cherre Huger, [place] 11/20/2015, 11:45 PM

## 2015-11-21 ENCOUNTER — Encounter (HOSPITAL_COMMUNITY): Payer: Self-pay

## 2015-11-21 LAB — CBC
HCT: 34.6 % — ABNORMAL LOW (ref 36.0–46.0)
HEMOGLOBIN: 11.9 g/dL — AB (ref 12.0–15.0)
MCH: 30.7 pg (ref 26.0–34.0)
MCHC: 34.4 g/dL (ref 30.0–36.0)
MCV: 89.2 fL (ref 78.0–100.0)
Platelets: 163 10*3/uL (ref 150–400)
RBC: 3.88 MIL/uL (ref 3.87–5.11)
RDW: 14.6 % (ref 11.5–15.5)
WBC: 11 10*3/uL — ABNORMAL HIGH (ref 4.0–10.5)

## 2015-11-21 LAB — RPR: RPR: NONREACTIVE

## 2015-11-21 MED ORDER — ONDANSETRON HCL 4 MG PO TABS: 4.0000 mg | ORAL_TABLET | ORAL | Status: DC | PRN

## 2015-11-21 MED ORDER — OXYCODONE-ACETAMINOPHEN 5-325 MG PO TABS: 2.0000 | ORAL_TABLET | ORAL | Status: DC | PRN

## 2015-11-21 MED ORDER — ACETAMINOPHEN 325 MG PO TABS: 650.0000 mg | ORAL_TABLET | ORAL | Status: DC | PRN

## 2015-11-21 MED ORDER — PRENATAL MULTIVITAMIN CH
1.0000 | ORAL_TABLET | Freq: Every day | ORAL | Status: DC
  Administered 2015-11-21: 1 via ORAL
  Filled 2015-11-21: qty 1

## 2015-11-21 MED ORDER — DIPHENHYDRAMINE HCL 25 MG PO CAPS: 25.0000 mg | ORAL_CAPSULE | Freq: Four times a day (QID) | ORAL | Status: DC | PRN

## 2015-11-21 MED ORDER — TETANUS-DIPHTH-ACELL PERTUSSIS 5-2.5-18.5 LF-MCG/0.5 IM SUSP: 0.5000 mL | Freq: Once | INTRAMUSCULAR | Status: DC

## 2015-11-21 MED ORDER — DIBUCAINE 1 % RE OINT: 1.0000 "application " | TOPICAL_OINTMENT | RECTAL | Status: DC | PRN

## 2015-11-21 MED ORDER — COCONUT OIL OIL: 1.0000 "application " | TOPICAL_OIL | Status: DC | PRN

## 2015-11-21 MED ORDER — IBUPROFEN 600 MG PO TABS
600.0000 mg | ORAL_TABLET | Freq: Four times a day (QID) | ORAL | Status: DC
  Administered 2015-11-21 – 2015-11-22 (×5): 600 mg via ORAL
  Filled 2015-11-21 (×5): qty 1

## 2015-11-21 MED ORDER — BENZOCAINE-MENTHOL 20-0.5 % EX AERO
1.0000 | INHALATION_SPRAY | CUTANEOUS | Status: DC | PRN
Start: 2015-11-21 — End: 2015-11-22
  Administered 2015-11-21: 1 via TOPICAL
  Filled 2015-11-21: qty 56

## 2015-11-21 MED ORDER — OXYCODONE-ACETAMINOPHEN 5-325 MG PO TABS: 1.0000 | ORAL_TABLET | ORAL | Status: DC | PRN

## 2015-11-21 MED ORDER — SENNOSIDES-DOCUSATE SODIUM 8.6-50 MG PO TABS
2.0000 | ORAL_TABLET | ORAL | Status: DC
  Administered 2015-11-21: 2 via ORAL
  Filled 2015-11-21: qty 2

## 2015-11-21 MED ORDER — ONDANSETRON HCL 4 MG/2ML IJ SOLN: 4.0000 mg | INTRAMUSCULAR | Status: DC | PRN

## 2015-11-21 MED ORDER — ZOLPIDEM TARTRATE 5 MG PO TABS: 5.0000 mg | ORAL_TABLET | Freq: Every evening | ORAL | Status: DC | PRN

## 2015-11-21 MED ORDER — SIMETHICONE 80 MG PO CHEW: 80.0000 mg | CHEWABLE_TABLET | ORAL | Status: DC | PRN

## 2015-11-21 MED ORDER — WITCH HAZEL-GLYCERIN EX PADS: 1.0000 "application " | MEDICATED_PAD | CUTANEOUS | Status: DC | PRN

## 2015-11-21 NOTE — Lactation Note (Signed)
This note was copied from a baby's chart. Lactation Consultation Note  Patient Name: Girl Delories Ziemer [id number] Date: 11/21/2015 Reason for consult: Initial assessment   Initial consult at 35 hrs old; GA 40.1; BW 7 lbs, 1.6 oz.  Mom Hx seizures but off meds since 2012.  Vag delivery; apgars 9/9.  Shoulder dystocia <1 min.   Mom is a P2 with 18 months experience BF first child.  Mom states BF is going well.  Denies any questions or concerns. Infant has breastfed x6 (10-20 min) + attempt x2 (5-7 min) since birth; voids-1; stools-2.  LS-9 by RN.   LC reviewed with parents cluster feeding, feeding cues, and size of infant's stomach.   Lactation brochure given and informed of hospital support group, IP and OP services. Encouraged to call for assistance as needed.     Maternal Data Does the patient have breastfeeding experience prior to this delivery?: Yes  Feeding Feeding Type: Breast Fed Length of feed: 15 min  LATCH Score/Interventions                      Lactation Tools Discussed/Used     Consult Status Consult Status: PRN    Merlene Laughter 11/21/2015, 6:14 PM

## 2015-11-22 NOTE — Discharge Instructions (Signed)
Breastfeeding Deciding to breastfeed is one of the best choices you can make for you and your baby. A change in hormones during pregnancy causes your breast tissue to grow and increases the number and size of your milk ducts. These hormones also allow proteins, sugars, and fats from your blood supply to make breast milk in your milk-producing glands. Hormones prevent breast milk from being released before your baby is born as well as prompt milk flow after birth. Once breastfeeding has begun, thoughts of your baby, as well as his or her sucking or crying, can stimulate the release of milk from your milk-producing glands.  BENEFITS OF BREASTFEEDING For Your Baby  Your first milk (colostrum) helps your baby's digestive system function better.  There are antibodies in your milk that help your baby fight off infections.  Your baby has a lower incidence of asthma, allergies, and sudden infant death syndrome.  The nutrients in breast milk are better for your baby than infant formulas and are designed uniquely for your baby's needs.  Breast milk improves your baby's brain development.  Your baby is less likely to develop other conditions, such as childhood obesity, asthma, or type 2 diabetes mellitus. For You  Breastfeeding helps to create a very special bond between you and your baby.  Breastfeeding is convenient. Breast milk is always available at the correct temperature and costs nothing.  Breastfeeding helps to burn calories and helps you lose the weight gained during pregnancy.  Breastfeeding makes your uterus contract to its prepregnancy size faster and slows bleeding (lochia) after you give birth.   Breastfeeding helps to lower your risk of developing type 2 diabetes mellitus, osteoporosis, and breast or ovarian cancer later in life. SIGNS THAT YOUR BABY IS HUNGRY Early Signs of Hunger  Increased alertness or activity.  Stretching.  Movement of the head from side to  side.  Movement of the head and opening of the mouth when the corner of the mouth or cheek is stroked (rooting).  Increased sucking sounds, smacking lips, cooing, sighing, or squeaking.  Hand-to-mouth movements.  Increased sucking of fingers or hands. Late Signs of Hunger  Fussing.  Intermittent crying. Extreme Signs of Hunger Signs of extreme hunger will require calming and consoling before your baby will be able to breastfeed successfully. Do not wait for the following signs of extreme hunger to occur before you initiate breastfeeding:  Restlessness.  A loud, strong cry.  Screaming. BREASTFEEDING BASICS Breastfeeding Initiation  Find a comfortable place to sit or lie down, with your neck and back well supported.  Place a pillow or rolled up blanket under your baby to bring him or her to the level of your breast (if you are seated). Nursing pillows are specially designed to help support your arms and your baby while you breastfeed.  Make sure that your baby's abdomen is facing your abdomen.  Gently massage your breast. With your fingertips, massage from your chest wall toward your nipple in a circular motion. This encourages milk flow. You may need to continue this action during the feeding if your milk flows slowly.  Support your breast with 4 fingers underneath and your thumb above your nipple. Make sure your fingers are well away from your nipple and your baby's mouth.  Stroke your baby's lips gently with your finger or nipple.  When your baby's mouth is open wide enough, quickly bring your baby to your breast, placing your entire nipple and as much of the colored area around your nipple (  areola) as possible into your baby's mouth.  More areola should be visible above your baby's upper lip than below the lower lip.  Your baby's tongue should be between his or her lower gum and your breast.  Ensure that your baby's mouth is correctly positioned around your nipple  (latched). Your baby's lips should create a seal on your breast and be turned out (everted).  It is common for your baby to suck about 2-3 minutes in order to start the flow of breast milk. Latching Teaching your baby how to latch on to your breast properly is very important. An improper latch can cause nipple pain and decreased milk supply for you and poor weight gain in your baby. Also, if your baby is not latched onto your nipple properly, he or she may swallow some air during feeding. This can make your baby fussy. Burping your baby when you switch breasts during the feeding can help to get rid of the air. However, teaching your baby to latch on properly is still the best way to prevent fussiness from swallowing air while breastfeeding. Signs that your baby has successfully latched on to your nipple:  Silent tugging or silent sucking, without causing you pain.  Swallowing heard between every 3-4 sucks.  Muscle movement above and in front of his or her ears while sucking. Signs that your baby has not successfully latched on to nipple:  Sucking sounds or smacking sounds from your baby while breastfeeding.  Nipple pain. If you think your baby has not latched on correctly, slip your finger into the corner of your baby's mouth to break the suction and place it between your baby's gums. Attempt breastfeeding initiation again. Signs of Successful Breastfeeding Signs from your baby:  A gradual decrease in the number of sucks or complete cessation of sucking.  Falling asleep.  Relaxation of his or her body.  Retention of a small amount of milk in his or her mouth.  Letting go of your breast by himself or herself. Signs from you:  Breasts that have increased in firmness, weight, and size 1-3 hours after feeding.  Breasts that are softer immediately after breastfeeding.  Increased milk volume, as well as a change in milk consistency and color by the fifth day of breastfeeding.  Nipples  that are not sore, cracked, or bleeding. Signs That Your [employer] is Getting Enough Milk  Wetting at least 3 diapers in a 24-hour period. The urine should be clear and pale yellow by age 842 days.  At least 3 stools in a 24-hour period by age 842 days. The stool should be soft and yellow.  At least 3 stools in a 24-hour period by age 84 days. The stool should be seedy and yellow.  No loss of weight greater than 10% of birth weight during the first 7 days of age.  Average weight gain of 4-7 ounces (113-198 g) per week after age 5 days.  Consistent daily weight gain by age 842 days, without weight loss after the age of 2 weeks. After a feeding, your baby may spit up a small amount. This is common. BREASTFEEDING FREQUENCY AND DURATION Frequent feeding will help you make more milk and can prevent sore nipples and breast engorgement. Breastfeed when you feel the need to reduce the fullness of your breasts or when your baby shows signs of hunger. This is called "breastfeeding on demand." Avoid introducing a pacifier to your baby while you are working to establish breastfeeding (the first 4-6 weeks  after your baby is born). After this time you may choose to use a pacifier. Research has shown that pacifier use during the first year of a baby's life decreases the risk of sudden infant death syndrome (SIDS). Allow your baby to feed on each breast as long as he or she wants. Breastfeed until your baby is finished feeding. When your baby unlatches or falls asleep while feeding from the first breast, offer the second breast. Because newborns are often sleepy in the first few weeks of life, you may need to awaken your baby to get him or her to feed. Breastfeeding times will vary from baby to baby. However, the following rules can serve as a guide to help you ensure that your baby is properly fed:  Newborns (babies 65 weeks of age or younger) may breastfeed every 1-3 hours.  Newborns should not go longer than 3 hours  during the day or 5 hours during the night without breastfeeding.  You should breastfeed your baby a minimum of 8 times in a 24-hour period until you begin to introduce solid foods to your baby at around 69 months of age. BREAST MILK PUMPING Pumping and storing breast milk allows you to ensure that your baby is exclusively fed your breast milk, even at times when you are unable to breastfeed. This is especially important if you are going back to work while you are still breastfeeding or when you are not able to be present during feedings. Your lactation consultant can give you guidelines on how long it is safe to store breast milk. A breast pump is a machine that allows you to pump milk from your breast into a sterile bottle. The pumped breast milk can then be stored in a refrigerator or freezer. Some breast pumps are operated by hand, while others use electricity. Ask your lactation consultant which type will work best for you. Breast pumps can be purchased, but some hospitals and breastfeeding support groups lease breast pumps on a monthly basis. A lactation consultant can teach you how to hand express breast milk, if you prefer not to use a pump. CARING FOR YOUR BREASTS WHILE YOU BREASTFEED Nipples can become dry, cracked, and sore while breastfeeding. The following recommendations can help keep your breasts moisturized and healthy:  Avoid using soap on your nipples.  Wear a supportive bra. Although not required, special nursing bras and tank tops are designed to allow access to your breasts for breastfeeding without taking off your entire bra or top. Avoid wearing underwire-style bras or extremely tight bras.  Air dry your nipples for 3-23mnutes after each feeding.  Use only cotton bra pads to absorb leaked breast milk. Leaking of breast milk between feedings is normal.  Use lanolin on your nipples after breastfeeding. Lanolin helps to maintain your skin's normal moisture barrier. If you use  pure lanolin, you do not need to wash it off before feeding your baby again. Pure lanolin is not toxic to your baby. You may also hand express a few drops of breast milk and gently massage that milk into your nipples and allow the milk to air dry. In the first few weeks after giving birth, some women experience extremely full breasts (engorgement). Engorgement can make your breasts feel heavy, warm, and tender to the touch. Engorgement peaks within 3-5 days after you give birth. The following recommendations can help ease engorgement:  Completely empty your breasts while breastfeeding or pumping. You may want to start by applying warm, moist heat (in  the shower or with warm water-soaked hand towels) just before feeding or pumping. This increases circulation and helps the milk flow. If your baby does not completely empty your breasts while breastfeeding, pump any extra milk after he or she is finished.  Wear a snug bra (nursing or regular) or tank top for 1-2 days to signal your body to slightly decrease milk production.  Apply ice packs to your breasts, unless this is too uncomfortable for you.  Make sure that your baby is latched on and positioned properly while breastfeeding. If engorgement persists after 48 hours of following these recommendations, contact your health care provider or a Science writer. OVERALL HEALTH CARE RECOMMENDATIONS WHILE BREASTFEEDING  Eat healthy foods. Alternate between meals and snacks, eating 3 of each per day. Because what you eat affects your breast milk, some of the foods may make your baby more irritable than usual. Avoid eating these foods if you are sure that they are negatively affecting your baby.  Drink milk, fruit juice, and water to satisfy your thirst (about 10 glasses a day).  Rest often, relax, and continue to take your prenatal vitamins to prevent fatigue, stress, and anemia.  Continue breast self-awareness checks.  Avoid chewing and smoking  tobacco. Chemicals from cigarettes that pass into breast milk and exposure to secondhand smoke may harm your baby.  Avoid alcohol and drug use, including marijuana. Some medicines that may be harmful to your baby can pass through breast milk. It is important to ask your health care provider before taking any medicine, including all over-the-counter and prescription medicine as well as vitamin and herbal supplements. It is possible to become pregnant while breastfeeding. If birth control is desired, ask your health care provider about options that will be safe for your baby. SEEK MEDICAL CARE IF:  You feel like you want to stop breastfeeding or have become frustrated with breastfeeding.  You have painful breasts or nipples.  Your nipples are cracked or bleeding.  Your breasts are red, tender, or warm.  You have a swollen area on either breast.  You have a fever or chills.  You have nausea or vomiting.  You have drainage other than breast milk from your nipples.  Your breasts do not become full before feedings by the fifth day after you give birth.  You feel sad and depressed.  Your baby is too sleepy to eat well.  Your baby is having trouble sleeping.   Your baby is wetting less than 3 diapers in a 24-hour period.  Your baby has less than 3 stools in a 24-hour period.  Your baby's skin or the white part of his or her eyes becomes yellow.   Your baby is not gaining weight by 73 days of age. SEEK IMMEDIATE MEDICAL CARE IF:  Your baby is overly tired (lethargic) and does not want to wake up and feed.  Your baby develops an unexplained fever.   This information is not intended to replace advice given to you by your health care provider. Make sure you discuss any questions you have with your health care provider.   Document Released: 07/04/2005 Document Revised: 03/25/2015 Document Reviewed: 12/26/2012 Elsevier Interactive Patient Education 2016 [place]. Postpartum  Care After Vaginal Delivery After you deliver your newborn (postpartum period), the usual stay in the hospital is 24-72 hours. If there were problems with your labor or delivery, or if you have other medical problems, you might be in the hospital longer.  While you are in the  hospital, you will receive help and instructions on how to care for yourself and your newborn during the postpartum period.  While you are in the hospital:  Be sure to tell your nurses if you have pain or discomfort, as well as where you feel the pain and what makes the pain worse.  If you had an incision made near your vagina (episiotomy) or if you had some tearing during delivery, the nurses may put ice packs on your episiotomy or tear. The ice packs may help to reduce the pain and swelling.  If you are breastfeeding, you may feel uncomfortable contractions of your uterus for a couple of weeks. This is normal. The contractions help your uterus get back to normal size.  It is normal to have some bleeding after delivery.  For the first 1-3 days after delivery, the flow is red and the amount may be similar to a period.  It is common for the flow to start and stop.  In the first few days, you may pass some small clots. Let your nurses know if you begin to pass large clots or your flow increases.  Do not  flush blood clots down the toilet before having the nurse look at them.  During the next 3-10 days after delivery, your flow should become more watery and pink or brown-tinged in color.  Ten to fourteen days after delivery, your flow should be a small amount of yellowish-white discharge.  The amount of your flow will decrease over the first few weeks after delivery. Your flow may stop in 6-8 weeks. Most women have had their flow stop by 12 weeks after delivery.  You should change your sanitary pads frequently.  Wash your hands thoroughly with soap and water for at least 20 seconds after changing pads, using the  toilet, or before holding or feeding your newborn.  You should feel like you need to empty your bladder within the first 6-8 hours after delivery.  In case you become weak, lightheaded, or faint, call your nurse before you get out of bed for the first time and before you take a shower for the first time.  Within the first few days after delivery, your breasts may begin to feel tender and full. This is called engorgement. Breast tenderness usually goes away within 48-72 hours after engorgement occurs. You may also notice milk leaking from your breasts. If you are not breastfeeding, do not stimulate your breasts. Breast stimulation can make your breasts produce more milk.  Spending as much time as possible with your newborn is very important. During this time, you and your newborn can feel close and get to know each other. Having your newborn stay in your room (rooming in) will help to strengthen the bond with your newborn. It will give you time to get to know your newborn and become comfortable caring for your newborn.  Your hormones change after delivery. Sometimes the hormone changes can temporarily cause you to feel sad or tearful. These feelings should not last more than a few days. If these feelings last longer than that, you should talk to your caregiver.  If desired, talk to your caregiver about methods of family planning or contraception.  Talk to your caregiver about immunizations. Your caregiver may want you to have the following immunizations before leaving the hospital:  Tetanus, diphtheria, and pertussis (Tdap) or tetanus and diphtheria (Td) immunization. It is very important that you and your family (including grandparents) or others caring for your newborn  are up-to-date with the Tdap or Td immunizations. The Tdap or Td immunization can help protect your newborn from getting ill.  Rubella immunization.  Varicella (chickenpox) immunization.  Influenza immunization. You should  receive this annual immunization if you did not receive the immunization during your pregnancy.   This information is not intended to replace advice given to you by your health care provider. Make sure you discuss any questions you have with your health care provider.   Document Released: 05/01/2007 Document Revised: 03/28/2012 Document Reviewed: 02/29/2012 Elsevier Interactive Patient Education 2016 [employer]. Iron-Rich Diet Iron is a mineral that helps your body to produce hemoglobin. Hemoglobin is a protein in your red blood cells that carries oxygen to your body's tissues. Eating too little iron may cause you to feel weak and tired, and it can increase your risk for infection. Eating enough iron is necessary for your body's metabolism, muscle function, and nervous system. Iron is naturally found in many foods. It can also be added to foods or fortified in foods. There are two types of dietary iron:  Heme iron. Heme iron is absorbed by the body more easily than nonheme iron. Heme iron is found in meat, poultry, and fish.  Nonheme iron. Nonheme iron is found in dietary supplements, iron-fortified grains, beans, and vegetables. You may need to follow an iron-rich diet if:  You have been diagnosed with iron deficiency or iron-deficiency anemia.  You have a condition that prevents you from absorbing dietary iron, such as:  Infection in your intestines.  Celiac disease. This involves long-lasting (chronic) inflammation of your intestines.  You do not eat enough iron.  You eat a diet that is high in foods that impair iron absorption.  You have lost a lot of blood.  You have heavy bleeding during your menstrual cycle.  You are pregnant. WHAT IS MY PLAN? Your health care provider may help you to determine how much iron you need per day based on your condition. Generally, when a person consumes sufficient amounts of iron in the diet, the following iron needs are met:  Men.  44-70  years old: 11 mg per day.  33-14 years old: 8 mg per day.  Women.   75-36 years old: 15 mg per day.  84-70 years old: 18 mg per day.  Over 75 years old: 8 mg per day.  Pregnant women: 27 mg per day.  Breastfeeding women: 9 mg per day. WHAT DO I NEED TO KNOW ABOUT AN IRON-RICH DIET?  Eat fresh fruits and vegetables that are high in vitamin C along with foods that are high in iron. This will help increase the amount of iron that your body absorbs from food, especially with foods containing nonheme iron. Foods that are high in vitamin C include oranges, peppers, tomatoes, and mango.  Take iron supplements only as directed by your health care provider. Overdose of iron can be life-threatening. If you were prescribed iron supplements, take them with orange juice or a vitamin C supplement.  Cook foods in pots and pans that are made from iron.   Eat nonheme iron-containing foods alongside foods that are high in heme iron. This helps to improve your iron absorption.   Certain foods and drinks contain compounds that impair iron absorption. Avoid eating these foods in the same meal as iron-rich foods or with iron supplements. These include:  Coffee, black tea, and red wine.  Milk, dairy products, and foods that are high in calcium.  Beans, soybeans, and  peas.  Whole grains.  When eating foods that contain both nonheme iron and compounds that impair iron absorption, follow these tips to absorb iron better.   Soak beans overnight before cooking.  Soak whole grains overnight and drain them before using.  Ferment flours before baking, such as using yeast in bread dough. WHAT FOODS CAN I EAT? Grains Iron-fortified breakfast cereal. Iron-fortified whole-wheat bread. Enriched rice. Sprouted grains. Vegetables Spinach. Potatoes with skin. Green peas. Broccoli. Red and green bell peppers. Fermented vegetables. Fruits Prunes. Raisins. Oranges. Strawberries. Mango.  Grapefruit. Meats and Other Protein Sources Beef liver. Oysters. Beef. Shrimp. [place]. Chicken. [place]. Sardines. Chickpeas. Nuts. Tofu. Beverages Tomato juice. Fresh orange juice. Prune juice. Hibiscus tea. Fortified instant breakfast shakes. Condiments Tahini. Fermented soy sauce. Sweets and Desserts Black-strap molasses.  Other Wheat germ. The items listed above may not be a complete list of recommended foods or beverages. Contact your dietitian for more options. WHAT FOODS ARE NOT RECOMMENDED? Grains Whole grains. Bran cereal. Bran flour. Oats. Vegetables Artichokes. Brussels sprouts. Kale. Fruits Blueberries. Raspberries. Strawberries. Figs. Meats and Other Protein Sources Soybeans. Products made from soy protein. Dairy Milk. Cream. Cheese. Yogurt. Cottage cheese. Beverages Coffee. Black tea. Red wine. Sweets and Desserts Cocoa. Chocolate. Ice cream. Other Basil. Oregano. Parsley. The items listed above may not be a complete list of foods and beverages to avoid. Contact your dietitian for more information.   This information is not intended to replace advice given to you by your health care provider. Make sure you discuss any questions you have with your health care provider.   Document Released: 02/15/2005 Document Revised: 07/25/2014 Document Reviewed: 01/29/2014 Elsevier Interactive Patient Education 2016 [employer]. Postpartum Depression and Baby Blues The postpartum period begins right after the birth of a baby. During this time, there is often a great amount of joy and excitement. It is also a time of many changes in the life of the parents. Regardless of how many times a mother gives birth, each child brings new challenges and dynamics to the family. It is not unusual to have feelings of excitement along with confusing shifts in moods, emotions, and thoughts. All mothers are at risk of developing postpartum depression or the "baby blues." These mood  changes can occur right after giving birth, or they may occur many months after giving birth. The baby blues or postpartum depression can be mild or severe. Additionally, postpartum depression can go away rather quickly, or it can be a long-term condition.  CAUSES Raised hormone levels and the rapid drop in those levels are thought to be a main cause of postpartum depression and the baby blues. A number of hormones change during and after pregnancy. Estrogen and progesterone usually decrease right after the delivery of your baby. The levels of thyroid hormone and various cortisol steroids also rapidly drop. Other factors that play a role in these mood changes include major life events and genetics.  RISK FACTORS If you have any of the following risks for the baby blues or postpartum depression, know what symptoms to watch out for during the postpartum period. Risk factors that may increase the likelihood of getting the baby blues or postpartum depression include:  Having a personal or family history of depression.   Having depression while being pregnant.   Having premenstrual mood issues or mood issues related to oral contraceptives.  Having a lot of life stress.   Having marital conflict.   Lacking a social support network.   Having a baby  with special needs.   Having health problems, such as diabetes.  SIGNS AND SYMPTOMS Symptoms of baby blues include:  Brief changes in mood, such as going from extreme happiness to sadness.  Decreased concentration.   Difficulty sleeping.   Crying spells, tearfulness.   Irritability.   Anxiety.  Symptoms of postpartum depression typically begin within the first month after giving birth. These symptoms include:  Difficulty sleeping or excessive sleepiness.   Marked weight loss.   Agitation.   Feelings of worthlessness.   Lack of interest in activity or food.  Postpartum psychosis is a very serious condition and can be  dangerous. Fortunately, it is rare. Displaying any of the following symptoms is cause for immediate medical attention. Symptoms of postpartum psychosis include:   Hallucinations and delusions.   Bizarre or disorganized behavior.   Confusion or disorientation.  DIAGNOSIS  A diagnosis is made by an evaluation of your symptoms. There are no medical or lab tests that lead to a diagnosis, but there are various questionnaires that a health care provider may use to identify those with the baby blues, postpartum depression, or psychosis. Often, a screening tool called the [place] Postnatal Depression Scale is used to diagnose depression in the postpartum period.  TREATMENT The baby blues usually goes away on its own in 1-2 weeks. Social support is often all that is needed. You will be encouraged to get adequate sleep and rest. Occasionally, you may be given medicines to help you sleep.  Postpartum depression requires treatment because it can last several months or longer if it is not treated. Treatment may include individual or group therapy, medicine, or both to address any social, physiological, and psychological factors that may play a role in the depression. Regular exercise, a healthy diet, rest, and social support may also be strongly recommended.  Postpartum psychosis is more serious and needs treatment right away. Hospitalization is often needed. HOME CARE INSTRUCTIONS  Get as much rest as you can. Nap when the baby sleeps.   Exercise regularly. Some women find yoga and walking to be beneficial.   Eat a balanced and nourishing diet.   Do little things that you enjoy. Have a cup of tea, take a bubble bath, read your favorite magazine, or listen to your favorite music.  Avoid alcohol.   Ask for help with household chores, cooking, grocery shopping, or running errands as needed. Do not try to do everything.   Talk to people close to you about how you are feeling. Get support from  your partner, family members, friends, or other new moms.  Try to stay positive in how you think. Think about the things you are grateful for.   Do not spend a lot of time alone.   Only take over-the-counter or prescription medicine as directed by your health care provider.  Keep all your postpartum appointments.   Let your health care provider know if you have any concerns.  SEEK MEDICAL CARE IF: You are having a reaction to or problems with your medicine. SEEK IMMEDIATE MEDICAL CARE IF:  You have suicidal feelings.   You think you may harm the baby or someone else. MAKE SURE YOU:  Understand these instructions.  Will watch your condition.  Will get help right away if you are not doing well or get worse.   This information is not intended to replace advice given to you by your health care provider. Make sure you discuss any questions you have with your health care provider.  Document Released: 04/07/2004 Document Revised: 07/09/2013 Document Reviewed: 04/15/2013 Elsevier Interactive Patient Education [employer].

## 2015-11-22 NOTE — Discharge Summary (Signed)
OB Discharge Summary     Patient Name: Jennifer Knox DOB: 01/14/1991 MRN: [id number]  Date of admission: 11/20/2015 Delivering MD: Lavetta Nielsen   Date of discharge: 11/22/2015  Admitting diagnosis: 69 WKS ACTIVE LABOR 3 TO 5 MIN Intrauterine pregnancy: [redacted]w[redacted]d     Secondary diagnosis:  Principal Problem:   Spontaneous vaginal delivery Active Problems:   Penicillin allergy (hives)   History of seizure disorder (no seizures since 2008; no meds since 2012)   Rh negative, maternal   GERD (gastroesophageal reflux disease)   Normal labor  Additional problems: none     Discharge diagnosis: Term Pregnancy Delivered                                                                                                Post partum procedures:none  Augmentation: none  Complications: None  Hospital course:  Onset of Labor With Vaginal Delivery     25 y.o. yo G2P2001 at [redacted]w[redacted]d was admitted in Active Labor on 11/20/2015. Patient had an uncomplicated labor course as follows:  Membrane Rupture Time/Date: 12:13 AM ,11/21/2015   Intrapartum Procedures: Episiotomy: None [1]                                         Lacerations:  2nd degree [3]  Patient had a delivery of a Viable infant. 11/21/2015  Information for the patient's newborn:  [place], Mccaa Girl Laiba [id number]        Pateint had an uncomplicated postpartum course.  She is ambulating, tolerating a regular diet, passing flatus, and urinating well. Patient is discharged home in stable condition on 11/22/2015.    Physical exam  Filed Vitals:   11/21/15 0800 11/21/15 1600 11/21/15 1800 11/22/15 0500  BP: 107/62 103/64 99/62 94/58   Pulse: 64 62 74 61  Temp: 98.3 F (36.8 C) 98.2 F (36.8 C) 97.6 F (36.4 C) 97.9 F (36.6 C)  TempSrc: Oral Oral  Oral  Resp: 18 18 17 18   Height:      Weight:       General: alert and cooperative Lochia: appropriate Uterine Fundus: firm Incision: Healing well  DVT Evaluation: No evidence of DVT seen on  physical exam. Negative Homan's sign. Labs: Lab Results  Component Value Date   WBC 11.0* 11/21/2015   HGB 11.9* 11/21/2015   HCT 34.6* 11/21/2015   MCV 89.2 11/21/2015   PLT 163 11/21/2015   No flowsheet data found.  Discharge instruction: per After Visit Summary and "Baby and Me Booklet".     Postpartum discharge after vaginal delivery, breastfeeding, Iron Rich foods, and Postpartum blue/depression  Information sheets included with discharge instructions.   After visit meds:    Medication List    TAKE these medications        prenatal multivitamin Tabs tablet  Take 1 tablet by mouth daily at 12 noon.        Diet: routine diet  Activity: Advance as tolerated. Pelvic rest for 6 weeks.   Outpatient follow up:6 weeks  Follow up Appt:No future appointments. Follow up Visit:No Follow-up on file.  Postpartum contraception: Natural Family Planning and Condoms  Newborn Data: Live born female  Birth Weight: 7 lb 1.6 oz (3221 g) APGAR: 9, 9  Baby Feeding: Breast Disposition:home with mother   11/22/2015 Lavetta Nielsen, CNM

## 2015-11-23 LAB — TYPE AND SCREEN
ABO/RH(D): O NEG
ANTIBODY SCREEN: POSITIVE
DAT, IgG: NEGATIVE
Unit division: 0
Unit division: 0

## 2018-02-17 ENCOUNTER — Inpatient Hospital Stay (HOSPITAL_COMMUNITY): Payer: Self-pay | Admitting: Anesthesiology

## 2018-02-17 ENCOUNTER — Encounter (HOSPITAL_COMMUNITY)
Admission: AD | Disposition: A | Discharge status: Home / Self Care | Payer: Self-pay | Source: Home / Self Care | Attending: Obstetrics & Gynecology

## 2018-02-17 ENCOUNTER — Ambulatory Visit (HOSPITAL_COMMUNITY)
Admission: AD | Admit: 2018-02-17 | Discharge: 2018-02-17 | Disposition: A | Discharge status: Home / Self Care | Payer: Self-pay | Attending: Obstetrics & Gynecology | Admitting: Obstetrics & Gynecology

## 2018-02-17 ENCOUNTER — Inpatient Hospital Stay (HOSPITAL_COMMUNITY): Payer: Self-pay

## 2018-02-17 ENCOUNTER — Encounter (HOSPITAL_COMMUNITY): Payer: Self-pay | Admitting: *Deleted

## 2018-02-17 DIAGNOSIS — O26891 Other specified pregnancy related conditions, first trimester: Secondary | ICD-10-CM

## 2018-02-17 DIAGNOSIS — Z6791 Unspecified blood type, Rh negative: Secondary | ICD-10-CM

## 2018-02-17 DIAGNOSIS — O209 Hemorrhage in early pregnancy, unspecified: Secondary | ICD-10-CM

## 2018-02-17 DIAGNOSIS — O034 Incomplete spontaneous abortion without complication: Secondary | ICD-10-CM | POA: Insufficient documentation

## 2018-02-17 DIAGNOSIS — O021 Missed abortion: Secondary | ICD-10-CM

## 2018-02-17 HISTORY — PX: DILATION AND EVACUATION: SHX1459

## 2018-02-17 LAB — CBC
HEMATOCRIT: 35 % — AB (ref 36.0–46.0)
Hemoglobin: 12.3 g/dL (ref 12.0–15.0)
MCH: 31.5 pg (ref 26.0–34.0)
MCHC: 35.1 g/dL (ref 30.0–36.0)
MCV: 89.7 fL (ref 78.0–100.0)
Platelets: 147 10*3/uL — ABNORMAL LOW (ref 150–400)
RBC: 3.9 MIL/uL (ref 3.87–5.11)
RDW: 13.9 % (ref 11.5–15.5)
WBC: 5.9 10*3/uL (ref 4.0–10.5)

## 2018-02-17 LAB — HCG, QUANTITATIVE, PREGNANCY: HCG, BETA CHAIN, QUANT, S: 7947 m[IU]/mL — AB (ref <5)

## 2018-02-17 SURGERY — DILATION AND EVACUATION, UTERUS
Anesthesia: General | Site: Vagina

## 2018-02-17 MED ORDER — RHO D IMMUNE GLOBULIN 1500 UNIT/2ML IJ SOSY
300.0000 ug | PREFILLED_SYRINGE | Freq: Once | INTRAMUSCULAR | Status: DC
  Filled 2018-02-17: qty 2

## 2018-02-17 MED ORDER — FENTANYL CITRATE (PF) 100 MCG/2ML IJ SOLN
INTRAMUSCULAR | Status: AC
  Filled 2018-02-17: qty 2

## 2018-02-17 MED ORDER — FENTANYL CITRATE (PF) 100 MCG/2ML IJ SOLN: 25.0000 ug | INTRAMUSCULAR | Status: DC | PRN

## 2018-02-17 MED ORDER — KETOROLAC TROMETHAMINE 30 MG/ML IJ SOLN: 30.0000 mg | Freq: Once | INTRAMUSCULAR | Status: DC

## 2018-02-17 MED ORDER — MENTHOL 3 MG MT LOZG
1.0000 | LOZENGE | OROMUCOSAL | Status: DC | PRN
  Filled 2018-02-17: qty 9

## 2018-02-17 MED ORDER — PROPOFOL 10 MG/ML IV BOLUS
INTRAVENOUS | Status: AC
  Filled 2018-02-17: qty 20

## 2018-02-17 MED ORDER — FENTANYL CITRATE (PF) 100 MCG/2ML IJ SOLN
INTRAMUSCULAR | Status: DC | PRN
  Administered 2018-02-17: 25 ug via INTRAVENOUS
  Administered 2018-02-17: 75 ug via INTRAVENOUS

## 2018-02-17 MED ORDER — ONDANSETRON HCL 4 MG/2ML IJ SOLN: 4.0000 mg | Freq: Four times a day (QID) | INTRAMUSCULAR | Status: DC | PRN

## 2018-02-17 MED ORDER — SILVER NITRATE-POT NITRATE 75-25 % EX MISC
CUTANEOUS | Status: DC | PRN
  Administered 2018-02-17: 1

## 2018-02-17 MED ORDER — IBUPROFEN 600 MG PO TABS: 600.0000 mg | ORAL_TABLET | Freq: Four times a day (QID) | ORAL | 3 refills | Status: DC | PRN

## 2018-02-17 MED ORDER — LACTATED RINGERS IV SOLN
INTRAVENOUS | Status: DC
  Administered 2018-02-17: 10:00:00 via INTRAVENOUS

## 2018-02-17 MED ORDER — ACETAMINOPHEN 500 MG PO TABS
1000.0000 mg | ORAL_TABLET | Freq: Once | ORAL | Status: AC
  Administered 2018-02-17: 1000 mg via ORAL
  Filled 2018-02-17: qty 2

## 2018-02-17 MED ORDER — DEXAMETHASONE SODIUM PHOSPHATE 4 MG/ML IJ SOLN
INTRAMUSCULAR | Status: DC | PRN
  Administered 2018-02-17: 4 mg via INTRAVENOUS

## 2018-02-17 MED ORDER — DOXYCYCLINE HYCLATE 100 MG PO CAPS: 100.0000 mg | ORAL_CAPSULE | Freq: Two times a day (BID) | ORAL | 0 refills | Status: AC

## 2018-02-17 MED ORDER — ACETAMINOPHEN 325 MG PO TABS: 650.0000 mg | ORAL_TABLET | ORAL | Status: DC | PRN

## 2018-02-17 MED ORDER — MIDAZOLAM HCL 2 MG/2ML IJ SOLN
INTRAMUSCULAR | Status: DC | PRN
  Administered 2018-02-17: 2 mg via INTRAVENOUS

## 2018-02-17 MED ORDER — DEXAMETHASONE SODIUM PHOSPHATE 4 MG/ML IJ SOLN
INTRAMUSCULAR | Status: AC
  Filled 2018-02-17: qty 1

## 2018-02-17 MED ORDER — HYDROCODONE-ACETAMINOPHEN 5-325 MG PO TABS: 1.0000 | ORAL_TABLET | ORAL | 0 refills | Status: DC | PRN

## 2018-02-17 MED ORDER — FAMOTIDINE IN NACL 20-0.9 MG/50ML-% IV SOLN
20.0000 mg | Freq: Once | INTRAVENOUS | Status: AC
  Administered 2018-02-17: 20 mg via INTRAVENOUS
  Filled 2018-02-17: qty 50

## 2018-02-17 MED ORDER — SCOPOLAMINE 1 MG/3DAYS TD PT72
MEDICATED_PATCH | TRANSDERMAL | Status: AC
  Filled 2018-02-17: qty 1

## 2018-02-17 MED ORDER — PROMETHAZINE HCL 25 MG/ML IJ SOLN: 6.2500 mg | INTRAMUSCULAR | Status: DC | PRN

## 2018-02-17 MED ORDER — LIDOCAINE HCL (CARDIAC) PF 100 MG/5ML IV SOSY
PREFILLED_SYRINGE | INTRAVENOUS | Status: AC
  Filled 2018-02-17: qty 5

## 2018-02-17 MED ORDER — HYDROMORPHONE HCL 1 MG/ML IJ SOLN: 0.2000 mg | INTRAMUSCULAR | Status: DC | PRN

## 2018-02-17 MED ORDER — LACTATED RINGERS IV SOLN: INTRAVENOUS | Status: DC

## 2018-02-17 MED ORDER — MIDAZOLAM HCL 2 MG/2ML IJ SOLN
INTRAMUSCULAR | Status: AC
  Filled 2018-02-17: qty 2

## 2018-02-17 MED ORDER — LACTATED RINGERS IV SOLN
INTRAVENOUS | Status: DC | PRN
  Administered 2018-02-17: 10:00:00 via INTRAVENOUS

## 2018-02-17 MED ORDER — ONDANSETRON HCL 4 MG/2ML IJ SOLN
INTRAMUSCULAR | Status: DC | PRN
  Administered 2018-02-17: 4 mg via INTRAVENOUS

## 2018-02-17 MED ORDER — LIDOCAINE HCL 1 % IJ SOLN
INTRAMUSCULAR | Status: DC | PRN
  Administered 2018-02-17: 8 mL

## 2018-02-17 MED ORDER — LACTATED RINGERS IV SOLN
INTRAVENOUS | Status: DC | PRN
Start: 2018-02-17 — End: 2018-02-17

## 2018-02-17 MED ORDER — SUCCINYLCHOLINE CHLORIDE 20 MG/ML IJ SOLN
INTRAMUSCULAR | Status: DC | PRN
  Administered 2018-02-17: 100 mg via INTRAVENOUS

## 2018-02-17 MED ORDER — SCOPOLAMINE 1 MG/3DAYS TD PT72
1.0000 | MEDICATED_PATCH | TRANSDERMAL | Status: DC
  Administered 2018-02-17: 1.5 mg via TRANSDERMAL
  Filled 2018-02-17: qty 1

## 2018-02-17 MED ORDER — SODIUM CHLORIDE 0.9 % IV SOLN
100.0000 mg | Freq: Once | INTRAVENOUS | Status: AC
  Administered 2018-02-17: 100 mg via INTRAVENOUS
  Filled 2018-02-17: qty 100

## 2018-02-17 MED ORDER — ONDANSETRON HCL 4 MG/2ML IJ SOLN
INTRAMUSCULAR | Status: AC
  Filled 2018-02-17: qty 2

## 2018-02-17 MED ORDER — SOD CITRATE-CITRIC ACID 500-334 MG/5ML PO SOLN
30.0000 mL | Freq: Once | ORAL | Status: AC
  Administered 2018-02-17: 30 mL via ORAL
  Filled 2018-02-17: qty 15

## 2018-02-17 MED ORDER — OXYCODONE-ACETAMINOPHEN 5-325 MG PO TABS: 1.0000 | ORAL_TABLET | ORAL | Status: DC | PRN

## 2018-02-17 MED ORDER — ONDANSETRON HCL 4 MG PO TABS: 4.0000 mg | ORAL_TABLET | Freq: Four times a day (QID) | ORAL | Status: DC | PRN

## 2018-02-17 MED ORDER — SIMETHICONE 80 MG PO CHEW: 80.0000 mg | CHEWABLE_TABLET | Freq: Four times a day (QID) | ORAL | Status: DC | PRN

## 2018-02-17 MED ORDER — PROPOFOL 10 MG/ML IV BOLUS
INTRAVENOUS | Status: DC | PRN
  Administered 2018-02-17: 170 mg via INTRAVENOUS

## 2018-02-17 MED ORDER — LIDOCAINE HCL (CARDIAC) PF 100 MG/5ML IV SOSY
PREFILLED_SYRINGE | INTRAVENOUS | Status: DC | PRN
  Administered 2018-02-17: 75 mg via INTRAVENOUS

## 2018-02-17 MED ORDER — OXYTOCIN 10 UNIT/ML IJ SOLN
INTRAMUSCULAR | Status: DC | PRN
  Administered 2018-02-17: 20 [IU]

## 2018-02-17 SURGICAL SUPPLY — 19 items
CATH ROBINSON RED A/P 16FR (CATHETERS) ×3 IMPLANT
DECANTER SPIKE VIAL GLASS SM (MISCELLANEOUS) ×3 IMPLANT
DILATOR CANAL MILEX (MISCELLANEOUS) IMPLANT
GLOVE BIO SURGEON STRL SZ 6.5 (GLOVE) ×2 IMPLANT
GLOVE BIO SURGEONS STRL SZ 6.5 (GLOVE) ×1
GLOVE BIOGEL PI IND STRL 7.0 (GLOVE) ×2 IMPLANT
GLOVE BIOGEL PI INDICATOR 7.0 (GLOVE) ×4
GOWN STRL REUS W/TWL LRG LVL3 (GOWN DISPOSABLE) ×6 IMPLANT
KIT BERKELEY 1ST TRIMESTER 3/8 (MISCELLANEOUS) ×3 IMPLANT
NS IRRIG 1000ML POUR BTL (IV SOLUTION) ×3 IMPLANT
PACK VAGINAL MINOR WOMEN LF (CUSTOM PROCEDURE TRAY) ×3 IMPLANT
PAD OB MATERNITY 4.3X12.25 (PERSONAL CARE ITEMS) ×3 IMPLANT
PAD PREP 24X48 CUFFED NSTRL (MISCELLANEOUS) ×3 IMPLANT
SET BERKELEY SUCTION TUBING (SUCTIONS) ×3 IMPLANT
TOWEL OR 17X24 6PK STRL BLUE (TOWEL DISPOSABLE) ×3 IMPLANT
VACURETTE 10 RIGID CVD (CANNULA) IMPLANT
VACURETTE 7MM CVD STRL WRAP (CANNULA) IMPLANT
VACURETTE 8 RIGID CVD (CANNULA) IMPLANT
VACURETTE 9 RIGID CVD (CANNULA) ×3 IMPLANT

## 2018-02-17 NOTE — MAU Note (Addendum)
Jennifer Knox is a 27 y.o. at [redacted]w[redacted]d here in MAU reporting:  +vaginal bleeding +few clots; small to that of a plum Endorses going through  3  Regular pads; vaginal bleeding noted through clothes onto stretcher upon EMS arrival @ 0722 Onset of complaint: 0530 this am Patient states she was told at a 9 week u/s that the babies were measuring 6 weeks and had already "passed". EDD was 09/05/18. Goes to [facility]; dr. Alesia Richards pt reports  +lower abdominal and back pain Cramping in nature; intermittent Pain score: 5/10  IV 20G saline locked noted in Left AC Vitals:   02/17/18 0725  BP: 110/70  Pulse: (!) 102  Resp: 17  Temp: 98.7 F (37.1 C)  SpO2: 100%

## 2018-02-17 NOTE — H&P (Signed)
Jennifer Knox is an 27 y.o. female G3P2002 at 62 + weeks with known missed ab diagnosed in office.  Patient opted for expectant management.  Gestational sacs seen in office w/o fetal poles measuring  6 weeks.  Patient received Rhogam for blood type of 0 negative. Last night patient started spotting and Earlier today had heavy bleeding bright red blood with passage of clots.  She denies feeling light headed or Dizzy.  She denies chest pain or shortness of breath.   Pertinent Gynecological History: Menses: usually normal Bleeding: bleeding heavy currently  Contraception: none DES exposure: denies Blood transfusions: none Sexually transmitted diseases: no past history Previous GYN Procedures: none  OB History: G3, P2 h/o two SVDs   Menstrual History: Menarche age: 66 No LMP recorded. Patient is pregnant.    Past Medical History:  Diagnosis Date  . Anemia   . GERD (gastroesophageal reflux disease)   . Seizures ([place]) 2006   No seizures since 2008, triggers:lack if sleep, no meds since 2012  . SVD (spontaneous vaginal delivery) 08/2013   [place]    Past Surgical History:  Procedure Laterality Date  . LAPAROSCOPY N/A 07/01/2014   Procedure: LAPAROSCOPY OPERATIVE With paratubal cystectomy;  Surgeon: Eldred Manges, MD;  Location: [facility];  Service: Gynecology;  Laterality: N/A;  . WISDOM TOOTH EXTRACTION      No family history on file.  Social History:  reports that she has never smoked. She has never used smokeless tobacco. She reports that she does not drink alcohol or use drugs.  Allergies:  Allergies  Allergen Reactions  . Penicillins Hives    Has patient had a PCN reaction causing immediate rash, facial/tongue/throat swelling, SOB or lightheadedness with hypotension: unknown Has patient had a PCN reaction causing severe rash involving mucus membranes or skin necrosis: unknown Has patient had a PCN reaction that required hospitalization no Has patient had a PCN  reaction occurring within the last 10 years: no If all of the above answers are "NO", then may proceed with Cephalosporin use.     Medications Prior to Admission  Medication Sig Dispense Refill Last Dose  . Prenatal Vit-Fe Fumarate-FA (PRENATAL MULTIVITAMIN) TABS tablet Take 1 tablet by mouth daily at 12 noon.   11/20/2015 at Unknown time    Review of Systems  Constitutional: Negative for chills and fever.  Eyes: Negative for blurred vision.  Gastrointestinal: Negative for nausea.  Neurological: Negative for dizziness and headaches.  All other systems reviewed and are negative.   Blood pressure 110/70, pulse (!) 102, temperature 98.7 F (37.1 C), temperature source Oral, resp. rate 17, SpO2 100 %, unknown if currently breastfeeding. Physical Exam  Nursing note and vitals reviewed. Constitutional: She appears well-developed and well-nourished. No distress.  Genitourinary:  Genitourinary Comments: Cervical os open, moderate amount of bleeding    Results for orders placed or performed during the hospital encounter of 02/17/18 (from the past 24 hour(s))  Rh IG workup (includes ABO/Rh)     Status: None (Preliminary result)   Collection Time: 02/17/18  7:54 AM  Result Value Ref Range   Gestational Age(Wks) 10    ABO/RH(D) O NEG    Antibody Screen      POS Performed at [facility], [address], [place], [place] [address]    Antibody Identification PENDING   hCG, quantitative, pregnancy     Status: Abnormal   Collection Time: 02/17/18  7:54 AM  Result Value Ref Range   hCG, Beta Chain, Quant,  S 7,947 (H) <5 mIU/mL  CBC     Status: Abnormal   Collection Time: 02/17/18  7:54 AM  Result Value Ref Range   WBC 5.9 4.0 - 10.5 K/uL   RBC 3.90 3.87 - 5.11 MIL/uL   Hemoglobin 12.3 12.0 - 15.0 g/dL   HCT 35.0 (L) 36.0 - 46.0 %   MCV 89.7 78.0 - 100.0 fL   MCH 31.5 26.0 - 34.0 pg   MCHC 35.1 30.0 - 36.0 g/dL   RDW 13.9 11.5 - 15.5 %   Platelets 147 (L) 150 - 400 K/uL    US  Ob Less Than 14 Weeks With Ob Transvaginal  Result Date: 02/17/2018 CLINICAL DATA:  Ten weeks pregnant by LMP.  Heavy vaginal bleeding. EXAM: OBSTETRIC <14 WK [place] AND TRANSVAGINAL OB US TECHNIQUE: Both transabdominal and transvaginal ultrasound examinations were performed for complete evaluation of the gestation as well as the maternal uterus, adnexal regions, and pelvic cul-de-sac. Transvaginal technique was performed to assess early pregnancy. COMPARISON:  None. FINDINGS: Intrauterine gestational sac: Single abnormal appearing gestational sac within the lower uterine segment/cervix. Yolk sac:  Visualized. Embryo:  Visualized. Cardiac Activity: Not Visualized. Heart Rate: Absent. MSD:   mm    w     d CRL:  7.3  mm   6 w   for d                  [place] EDC: Subchorionic hemorrhage:  Large Maternal uterus/adnexae: Maternal ovaries are unremarkable. No adnexal mass or free fluid demonstrated. IMPRESSION: 1. Single abnormal appearing gestational sac within the region of the cervix or lower uterine segment, indicating spontaneous abortion in progress. Single fetal pole identified within this abnormal gestational sac without cardiac activity, consistent with associated fetal demise. 2. A second fetal pole or gestational sac is not convincingly identified. Blood products versus second abnormal appearing gestational sac within the cervix, favor blood products. 3. Large subchorionic hemorrhage. Electronically Signed   By: Franki Cabot M.D.   On: 02/17/2018 08:57    Assessment/Plan: 27 yo old with incomplete AB of twins Discussed risks of D&E with patient to include uterine perforation, production of Asherman's syndrome or  Scarring w/i the uterus hindering future pregnancies, hemorrhage, and infection.  Patient voiced understanding and agrees to proceed with procedure Consent signed, witnessed and placed into chart.  Doxycycline 100 mg IV on call to OR.  Will proceed with dilatation and evacuation via suction  D&C Samatha Anspach, [place] 02/17/2018, 9:42 AM

## 2018-02-17 NOTE — Discharge Instructions (Addendum)
Patient will follow up her procedure with her next O.B. Appointment in two weeks.

## 2018-02-17 NOTE — Anesthesia Postprocedure Evaluation (Signed)
Anesthesia Post Note  Patient: Jennifer Knox  Procedure(s) Performed: DILATATION AND EVACUATION (N/A Vagina )     Patient location during evaluation: PACU Anesthesia Type: General Level of consciousness: awake and alert, awake and oriented Pain management: pain level controlled Vital Signs Assessment: post-procedure vital signs reviewed and stable Respiratory status: spontaneous breathing, nonlabored ventilation and respiratory function stable Cardiovascular status: blood pressure returned to baseline and stable Postop Assessment: no apparent nausea or vomiting Anesthetic complications: no    Last Vitals:  Vitals:   02/17/18 1115 02/17/18 1130  BP: 102/73 100/70  Pulse: 71 74  Resp: 13 12  Temp:    SpO2: 96% 98%    Last Pain:  Vitals:   02/17/18 1130  TempSrc:   PainSc: 0-No pain   Pain Goal: Patients Stated Pain Goal: 0 (02/17/18 0726)               Catalina Gravel

## 2018-02-17 NOTE — Anesthesia Preprocedure Evaluation (Signed)
Anesthesia Evaluation  Patient identified by MRN, date of birth, ID band Patient awake    Reviewed: Allergy & Precautions, NPO status , Patient's Chart, lab work & pertinent test results  Airway Mallampati: II  TM Distance: >3 FB Neck ROM: Full    Dental  (+) Teeth Intact, Dental Advisory Given   Pulmonary neg pulmonary ROS,    Pulmonary exam normal breath sounds clear to auscultation       Cardiovascular Exercise Tolerance: Good negative cardio ROS Normal cardiovascular exam Rhythm:Regular Rate:Normal     Neuro/Psych Seizures - (last seizure 2008), Well Controlled,     GI/Hepatic Neg liver ROS, GERD  ,  Endo/Other  negative endocrine ROS  Renal/GU negative Renal ROS     Musculoskeletal negative musculoskeletal ROS (+)   Abdominal   Peds  Hematology  (+) Blood dyscrasia (Thrombocytopenia--Plt 147k), ,   Anesthesia Other Findings Day of surgery medications reviewed with the patient.  Reproductive/Obstetrics (+) Pregnancy Missed abortion 11-12wks                             Anesthesia Physical Anesthesia Plan  ASA: II and emergent  Anesthesia Plan: General   Post-op Pain Management:    Induction: Intravenous and Rapid sequence  PONV Risk Score and Plan: 4 or greater and Midazolam, Dexamethasone and Ondansetron  Airway Management Planned: Oral ETT  Additional Equipment:   Intra-op Plan:   Post-operative Plan: Extubation in OR  Informed Consent: I have reviewed the patients History and Physical, chart, labs and discussed the procedure including the risks, benefits and alternatives for the proposed anesthesia with the patient or authorized representative who has indicated his/her understanding and acceptance.   Dental advisory given  Plan Discussed with: CRNA  Anesthesia Plan Comments:         Anesthesia Quick Evaluation

## 2018-02-17 NOTE — MAU Note (Signed)
Pt received Rhogam 02/07/18 per Dr. Alwyn Pea

## 2018-02-17 NOTE — Op Note (Signed)
Operative Report  Jennifer Knox female 27 y.o. February 17, 2018  Procedure:    DILATATION AND EVACUATION   Preoperative Diagnosis:  9 week incomplete ab  Post operative Diagnosis:  same   Indications: The patient with retained products of conception, hemorrhaging open cervix     Surgeon: Sanjuana Kava   Assistants: none  Anesthesia: General ET anesthesia and Local anesthesia 1% buffered lidocaine  ASA Class: 2  Procedure Detail  Findings:  8 week size uterus to 6 size post procedure. Moderate amount of retained POC.  Good crie was achieved.  Estimated Blood Loss:  less than 50 mL         Drains: straight cath prior to procedure         Total IV Fluids: 300 ml  Blood Given: none          Specimens: POC  Antibiotics: Vibramycin 100 mg IV         Implants: none        Complications:  None         Technique:   Patient was placed in dorsal lithotomy position in [employer]. After adequate anesthesia was achieved, the patient was prepped and draped in the usual sterile fashion. The operative Graves speculum was placed in the vagina.  Products and tissue was already in the vagina at the level of the external os and was removed with a ring forceps. The cervix stabilized with a single-tooth tenaculum.  A paracervical block using 1% lidocaine was performed. The cervix was already dilated. A 9 mm curved curette was used to remove contents of the uterus.  Alternating sharp curettage with a curette and suction curettage was performed until all contents were removed and good crie was achieved.  All instruments were removed from the vagina.  The patient tolerated the procedure well.    Disposition: PACU - hemodynamically stable.         Condition: stable  Sheryl Towell [name]

## 2018-02-17 NOTE — Transfer of Care (Signed)
Immediate Anesthesia Transfer of Care Note  Patient: Jennifer Knox  Procedure(s) Performed: DILATATION AND EVACUATION (N/A Vagina )  Patient Location: PACU  Anesthesia Type:General  Level of Consciousness: awake, drowsy and patient cooperative  Airway & Oxygen Therapy: Patient Spontanous Breathing and Patient connected to nasal cannula oxygen  Post-op Assessment: Report given to RN and Post -op Vital signs reviewed and stable  Post vital signs: Reviewed and stable  Last Vitals:  Vitals Value Taken Time  BP 106/73 02/17/2018 11:00 AM  Temp    Pulse 75 02/17/2018 11:06 AM  Resp 9 02/17/2018 11:06 AM  SpO2 100 % 02/17/2018 11:06 AM  Vitals shown include unvalidated device data.  Last Pain:  Vitals:   02/17/18 0726  TempSrc:   PainSc: 5       Patients Stated Pain Goal: 0 (66/29/47 6546)  Complications: No apparent anesthesia complications

## 2018-02-17 NOTE — MAU Provider Note (Addendum)
History     CSN: [id number]  Arrival date and time: 02/17/18 7829   First Provider Initiated Contact with Patient 02/17/18 [phone]      No chief complaint on file.  HPI  Jennifer Knox is a 27 y.o. G3P2001 at [redacted]w[redacted]d who presents to MAU via EMS with heavy vaginal bleeding in the setting of previously identified non-viable pregnancy, twin gestation. Denies fever, falls, or recent illness.    Vaginal bleeding This is a new problem. Onset 0530 today. Patient has been seeing bright red vaginal bleeding with multiple clots. Saturating multiple pads at home. Denies lightheadedness, episodes of syncope, SOB.  Abdominal cramping This is a new problem. Onset 0530 today. Patient reports she experienced new onset abdominal cramping at 0530 today that worsened significantly. Pain is 4-5/10, bilateral low abdomen, does not radiate, no aggravating or alleviating factors. Has not taken pain medication.  Patient states she has been told by [facility] providers that her twins were not growing as expected and heartbeats could not be found on 9 week ultrasound.    OB History    Gravida  3   Para  2   Term  2   Preterm      AB      Living  1     SAB      TAB      Ectopic      Multiple  0   Live Births  1           Past Medical History:  Diagnosis Date  . Anemia   . GERD (gastroesophageal reflux disease)   . Seizures ([place]) 2006   No seizures since 2008, triggers:lack if sleep, no meds since 2012  . SVD (spontaneous vaginal delivery) 08/2013   x 1 [place]    Past Surgical History:  Procedure Laterality Date  . LAPAROSCOPY N/A 07/01/2014   Procedure: LAPAROSCOPY OPERATIVE With paratubal cystectomy;  Surgeon: Eldred Manges, MD;  Location: [facility];  Service: Gynecology;  Laterality: N/A;  . WISDOM TOOTH EXTRACTION      No family history on file.  Social History   Tobacco Use  . Smoking status: Never Smoker  . Smokeless tobacco: Never Used  Substance Use Topics  . Alcohol  use: No  . Drug use: No    Allergies:  Allergies  Allergen Reactions  . Penicillins Hives    Has patient had a PCN reaction causing immediate rash, facial/tongue/throat swelling, SOB or lightheadedness with hypotension: unknown Has patient had a PCN reaction causing severe rash involving mucus membranes or skin necrosis: unknown Has patient had a PCN reaction that required hospitalization no Has patient had a PCN reaction occurring within the last 10 years: no If all of the above answers are "NO", then may proceed with Cephalosporin use.     Medications Prior to Admission  Medication Sig Dispense Refill Last Dose  . Prenatal Vit-Fe Fumarate-FA (PRENATAL MULTIVITAMIN) TABS tablet Take 1 tablet by mouth daily at 12 noon.   11/20/2015 at Unknown time    Review of Systems  Constitutional: Negative for fever.  Gastrointestinal: Negative for abdominal pain, nausea and vomiting.  Genitourinary: Positive for vaginal bleeding. Negative for vaginal pain.  Musculoskeletal: Positive for back pain.  Neurological: Negative for dizziness and headaches.  All other systems reviewed and are negative.  Physical Exam   Blood pressure 110/70, pulse (!) 102, temperature 98.7 F (37.1 C), temperature source Oral, resp. rate 17, SpO2 100 %, unknown if currently breastfeeding.  Physical Exam  Nursing note and vitals reviewed. Constitutional: She is oriented to person, place, and time. She appears well-developed.  Cardiovascular: Normal rate, regular rhythm and normal heart sounds.  Respiratory: Effort normal.  GI: Soft.  Genitourinary: Vaginal discharge found.  Genitourinary Comments: Bright red vaginal bleeding on arrival to MAU  Neurological: She is alert and oriented to person, place, and time. She has normal reflexes.  Skin: Skin is warm, dry and intact. No pallor.  Psychiatric: She has a normal mood and affect. Her behavior is normal. Judgment and thought content normal.    MAU Course   Procedures  MDM --blood type O NEG: Rhogam  Patient Vitals for the past 24 hrs:  BP Temp Temp src Pulse Resp SpO2  02/17/18 0725 110/70 98.7 F (37.1 C) Oral (!) 102 17 100 %    Orders Placed This Encounter  Procedures  . US OB LESS THAN 14 WEEKS WITH OB TRANSVAGINAL  . Urinalysis, Routine w reflex microscopic  . hCG, quantitative, pregnancy  . CBC  . Rh IG workup (includes ABO/Rh)   Report given to M. Drake Leach, CNM to assumes management of patient at this time  Mallie Snooks, [place] 02/17/18  8:06 AM    Assessed pt: 3-4 large pads moderately saturated since arrival, some small clots. Pain is improved. Had Rhogam in office 1 week ago. [place] reviewed. Consult with Dr. Alwyn Pea, plan for OR. NPO.  US Ob Less Than 14 Weeks With Ob Transvaginal  Result Date: 02/17/2018 CLINICAL DATA:  Ten weeks pregnant by LMP.  Heavy vaginal bleeding. EXAM: OBSTETRIC <14 WK [place] AND TRANSVAGINAL OB US TECHNIQUE: Both transabdominal and transvaginal ultrasound examinations were performed for complete evaluation of the gestation as well as the maternal uterus, adnexal regions, and pelvic cul-de-sac. Transvaginal technique was performed to assess early pregnancy. COMPARISON:  None. FINDINGS: Intrauterine gestational sac: Single abnormal appearing gestational sac within the lower uterine segment/cervix. Yolk sac:  Visualized. Embryo:  Visualized. Cardiac Activity: Not Visualized. Heart Rate: Absent. MSD:   mm    w     d CRL:  7.3  mm   6 w   for d                  [place] EDC: Subchorionic hemorrhage:  Large Maternal uterus/adnexae: Maternal ovaries are unremarkable. No adnexal mass or free fluid demonstrated. IMPRESSION: 1. Single abnormal appearing gestational sac within the region of the cervix or lower uterine segment, indicating spontaneous abortion in progress. Single fetal pole identified within this abnormal gestational sac without cardiac activity, consistent with associated fetal demise. 2. A second fetal pole or  gestational sac is not convincingly identified. Blood products versus second abnormal appearing gestational sac within the cervix, favor blood products. 3. Large subchorionic hemorrhage. Electronically Signed   By: Franki Cabot M.D.   On: 02/17/2018 08:57   Assessment and Plan   1. Missed abortion   2. Bleeding in early pregnancy   3. Rh negative status during pregnancy in first trimester    Admit to OR Mng[facility] per Dr. Mindi Curling, [place], CNM  02/17/2018 9:37 AM

## 2018-02-17 NOTE — Anesthesia Procedure Notes (Signed)
Procedure Name: Intubation Date/Time: 02/17/2018 10:27 AM Performed by: Sandrea Matte, CRNA Pre-anesthesia Checklist: Patient identified, Emergency Drugs available, Suction available and Patient being monitored Patient Re-evaluated:Patient Re-evaluated prior to induction Oxygen Delivery Method: Circle system utilized Preoxygenation: Pre-oxygenation with 100% oxygen Induction Type: IV induction, Cricoid Pressure applied and Rapid sequence Laryngoscope Size: Mac and 3 Grade View: Grade II Tube type: Oral Tube size: 7.0 mm Number of attempts: 1 Airway Equipment and Method: Stylet Placement Confirmation: ETT inserted through vocal cords under direct vision,  positive ETCO2 and breath sounds checked- equal and bilateral Dental Injury: Teeth and Oropharynx as per pre-operative assessment

## 2018-02-18 ENCOUNTER — Encounter (HOSPITAL_COMMUNITY): Payer: Self-pay | Admitting: Obstetrics & Gynecology

## 2018-02-19 LAB — RH IG WORKUP (INCLUDES ABO/RH)
ABO/RH(D): O NEG
Antibody Screen: POSITIVE
Gestational Age(Wks): 10
UNIT DIVISION: 0

## 2018-02-21 LAB — BPAM RBC
BLOOD PRODUCT EXPIRATION DATE: 201908212359
Blood Product Expiration Date: 201908212359
Unit Type and Rh: 9500
Unit Type and Rh: 9500

## 2018-02-21 LAB — TYPE AND SCREEN
ABO/RH(D): O NEG
Antibody Screen: POSITIVE
Unit division: 0
Unit division: 0

## 2018-02-22 ENCOUNTER — Emergency Department (HOSPITAL_COMMUNITY): Payer: Self-pay

## 2018-02-22 ENCOUNTER — Other Ambulatory Visit: Payer: Self-pay

## 2018-02-22 ENCOUNTER — Emergency Department (HOSPITAL_COMMUNITY)
Admission: EM | Admit: 2018-02-22 | Discharge: 2018-02-23 | Disposition: A | Discharge status: Home / Self Care | Payer: Self-pay | Attending: Emergency Medicine | Admitting: Emergency Medicine

## 2018-02-22 ENCOUNTER — Encounter (HOSPITAL_COMMUNITY): Payer: Self-pay | Admitting: Emergency Medicine

## 2018-02-22 DIAGNOSIS — R072 Precordial pain: Secondary | ICD-10-CM | POA: Insufficient documentation

## 2018-02-22 DIAGNOSIS — R51 Headache: Secondary | ICD-10-CM | POA: Insufficient documentation

## 2018-02-22 DIAGNOSIS — M7918 Myalgia, other site: Secondary | ICD-10-CM | POA: Insufficient documentation

## 2018-02-22 DIAGNOSIS — Z79899 Other long term (current) drug therapy: Secondary | ICD-10-CM | POA: Insufficient documentation

## 2018-02-22 LAB — CBC WITH DIFFERENTIAL/PLATELET
ABS IMMATURE GRANULOCYTES: 0 10*3/uL (ref 0.0–0.1)
BASOS ABS: 0 10*3/uL (ref 0.0–0.1)
Basophils Relative: 0 %
Eosinophils Absolute: 0.2 10*3/uL (ref 0.0–0.7)
Eosinophils Relative: 3 %
HCT: 35.2 % — ABNORMAL LOW (ref 36.0–46.0)
HEMOGLOBIN: 11.5 g/dL — AB (ref 12.0–15.0)
IMMATURE GRANULOCYTES: 0 %
LYMPHS PCT: 43 %
Lymphs Abs: 2.2 10*3/uL (ref 0.7–4.0)
MCH: 30.5 pg (ref 26.0–34.0)
MCHC: 32.7 g/dL (ref 30.0–36.0)
MCV: 93.4 fL (ref 78.0–100.0)
MONO ABS: 0.4 10*3/uL (ref 0.1–1.0)
Monocytes Relative: 8 %
NEUTROS ABS: 2.4 10*3/uL (ref 1.7–7.7)
NEUTROS PCT: 46 %
PLATELETS: 232 10*3/uL (ref 150–400)
RBC: 3.77 MIL/uL — AB (ref 3.87–5.11)
RDW: 13.5 % (ref 11.5–15.5)
WBC: 5.2 10*3/uL (ref 4.0–10.5)

## 2018-02-22 LAB — COMPREHENSIVE METABOLIC PANEL
ALBUMIN: 4.1 g/dL (ref 3.5–5.0)
ALT: 16 U/L (ref 0–44)
ANION GAP: 8 (ref 5–15)
AST: 21 U/L (ref 15–41)
Alkaline Phosphatase: 49 U/L (ref 38–126)
BILIRUBIN TOTAL: 0.6 mg/dL (ref 0.3–1.2)
BUN: 13 mg/dL (ref 6–20)
CO2: 24 mmol/L (ref 22–32)
Calcium: 8.9 mg/dL (ref 8.9–10.3)
Chloride: 108 mmol/L (ref 98–111)
Creatinine, Ser: 0.69 mg/dL (ref 0.44–1.00)
GFR calc non Af Amer: >60 mL/min (ref >60)
GLUCOSE: 89 mg/dL (ref 70–99)
POTASSIUM: 4.3 mmol/L (ref 3.5–5.1)
Sodium: 140 mmol/L (ref 135–145)
TOTAL PROTEIN: 6.6 g/dL (ref 6.5–8.1)

## 2018-02-22 LAB — I-STAT TROPONIN, ED: TROPONIN I, POC: 0 ng/mL (ref 0.00–0.08)

## 2018-02-22 LAB — I-STAT BETA HCG BLOOD, ED (MC, WL, AP ONLY): HCG, QUANTITATIVE: 325.4 m[IU]/mL — AB (ref <5)

## 2018-02-22 NOTE — ED Notes (Signed)
Results reviewed.  No changes in acuity at this time 

## 2018-02-22 NOTE — ED Triage Notes (Signed)
Pt began having centralized chest pressure with nausea but no vomiting. No SHOB.  Pt had a D&C on Saturday and this was her concern

## 2018-02-22 NOTE — ED Provider Notes (Signed)
Patient placed in Quick Look pathway, seen and evaluated   Chief Complaint: Chest pain  HPI:   Endorses central chest pressure radiating into the left side of the neck and around to the back of the neck beginning today.  Also endorses headache in the back of the head.  Patient underwent D&E on 8/3 for incomplete abortion at 11 weeks.  Denies shortness of breath, vision changes, nausea/vomiting, fever, cough, increased vaginal bleeding.  ROS: Chest pain (one)  Physical Exam:   Gen: No distress  Neuro: Awake and Alert  Skin: Warm    Focused Exam:   No diaphoresis.  No pallor.  Pulmonary: No increased work of breathing.  Speaks in full sentences without difficulty.  Lung sounds clear.  No tachypnea.  Cardiac: Normal rate and regular. Peripheral pulses intact.  Abdominal: No abdominal tenderness.  No peritoneal signs.  No rebound tenderness.  No guarding.    Neurologic: Motor function intact in all 4 extremities.  Grip strength equal.  Coordination intact.  MSK: No peripheral edema.   Initiation of care has begun. The patient has been counseled on the process, plan, and necessity for staying for the completion/evaluation, and the remainder of the medical screening examination   Layla Maw 02/22/18 2057    Drenda Freeze, MD 02/23/18 [phone]

## 2018-02-23 MED ORDER — CYCLOBENZAPRINE HCL 10 MG PO TABS: 10.0000 mg | ORAL_TABLET | Freq: Three times a day (TID) | ORAL | 0 refills | Status: DC | PRN

## 2018-02-23 MED ORDER — DOXYCYCLINE HYCLATE 100 MG PO TABS
100.0000 mg | ORAL_TABLET | Freq: Once | ORAL | Status: AC
  Administered 2018-02-23: 100 mg via ORAL
  Filled 2018-02-23: qty 1

## 2018-02-23 MED ORDER — IBUPROFEN 600 MG PO TABS: 600.0000 mg | ORAL_TABLET | Freq: Three times a day (TID) | ORAL | 0 refills | Status: DC | PRN

## 2018-02-23 NOTE — ED Provider Notes (Addendum)
[facility] EMERGENCY DEPARTMENT Provider Note   CSN: [id number] Arrival date & time: 02/22/18  2028     History   Chief Complaint Chief Complaint  Patient presents with  . Chest Pain    HPI Jennifer Knox is a 27 y.o. female.  HPI 27 year old female presents the emergency department with discomfort in her bilateral trapezius region with radiation up in her neck.  This been persistent and worsening through the evening.  She recently underwent dilation and evacuation after fetal demise in the first trimester.  Reports she is feeling better at this time and is no longer having chest discomfort.  She described a tightness in her chest which was present this evening and lasted for some time.  She contacted her obstetrician recommend that she come to the ER for evaluation given the chest discomfort.  No history of DVT or pulmonary embolism.  No family history of venous thromboembolic disease.  No shortness of breath at this time.  No fevers or chills.  She was under general anesthesia for her procedure which was performed on February 17, 2018 but this was not a prolonged procedure.  She had no significant symptoms after the procedure itself besides expected pelvic pain which has since resolved.  She has not tried any medication prior to arrival.   Past Medical History:  Diagnosis Date  . Anemia   . GERD (gastroesophageal reflux disease)   . Seizures ([place]) 2006   No seizures since 2008, triggers:lack if sleep, no meds since 2012  . SVD (spontaneous vaginal delivery) 08/2013   x 1 [place]    Patient Active Problem List   Diagnosis Date Noted  . Spontaneous vaginal delivery 11/21/2015  . Normal labor 11/20/2015  . Penicillin allergy (hives) 11/05/2015  . History of seizure disorder (no seizures since 2008; no meds since 2012) 11/05/2015  . Rh negative, maternal 11/05/2015  . GERD (gastroesophageal reflux disease) 11/05/2015  . Pelvic mass in female 07/01/2014  .  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 DILATION AND EVACUATION N/A 02/17/2018   Procedure: DILATATION AND EVACUATION;  Surgeon: Sanjuana Kava, MD;  Location: [facility];  Service: Gynecology;  Laterality: N/A;  . LAPAROSCOPY N/A 07/01/2014   Procedure: LAPAROSCOPY OPERATIVE With paratubal cystectomy;  Surgeon: Eldred Manges, MD;  Location: [facility];  Service: Gynecology;  Laterality: N/A;  . WISDOM TOOTH EXTRACTION       OB History    Gravida  3   Para  2   Term  2   Preterm      AB      Living  1     SAB      TAB      Ectopic      Multiple  0   Live Births  1            Home Medications    Prior to Admission medications   Medication Sig Start Date End Date Taking? Authorizing Provider  cyclobenzaprine (FLEXERIL) 10 MG tablet Take 1 tablet (10 mg total) by mouth 3 (three) times daily as needed for muscle spasms. 02/23/18   Jola Schmidt, MD  doxycycline (VIBRAMYCIN) 100 MG capsule Take 1 capsule (100 mg total) by mouth 2 (two) times daily for 7 days. 02/17/18 02/24/18  Sanjuana Kava, MD  HYDROcodone-acetaminophen (NORCO/VICODIN) 5-325 MG tablet Take 1-2 tablets by mouth every 4 (four) hours as needed for severe pain. 02/17/18   Pinn,  Rogelio Seen, MD  ibuprofen (ADVIL,MOTRIN) 600 MG tablet Take 1 tablet (600 mg total) by mouth every 8 (eight) hours as needed. 02/23/18   Jola Schmidt, MD  Prenatal Vit-Fe Fumarate-FA (PRENATAL MULTIVITAMIN) TABS tablet Take 1 tablet by mouth daily at 12 noon.    [provider]    Family History No family history on file.  Social History Social History   Tobacco Use  . Smoking status: Never Smoker  . Smokeless tobacco: Never Used  Substance Use Topics  . Alcohol use: No  . Drug use: No     Allergies   Penicillins   Review of Systems Review of Systems  All other systems reviewed and are negative.    Physical Exam Updated Vital Signs BP 97/62   Pulse 66   Temp 97.9 F (36.6 C)  (Oral)   Resp 15   SpO2 100%   Physical Exam  Constitutional: She is oriented to person, place, and time. She appears well-developed and well-nourished. No distress.  HENT:  Head: Normocephalic and atraumatic.  Eyes: EOM are normal.  Neck: Normal range of motion. Neck supple.  No cervical point tenderness.  Cardiovascular: Normal rate and regular rhythm.  Pulmonary/Chest: Effort normal and breath sounds normal.  Abdominal: Soft. She exhibits no distension. There is no tenderness.  Musculoskeletal: Normal range of motion.  Neurological: She is alert and oriented to person, place, and time.  Skin: Skin is warm and dry.  Psychiatric: She has a normal mood and affect. Judgment normal.  Nursing note and vitals reviewed.    ED Treatments / Results  Labs (all labs ordered are listed, but only abnormal results are displayed) Labs Reviewed  CBC WITH DIFFERENTIAL/PLATELET - Abnormal; Notable for the following components:      Result Value   RBC 3.77 (*)    Hemoglobin 11.5 (*)    HCT 35.2 (*)    All other components within normal limits  I-STAT BETA HCG BLOOD, ED ([facility], [facility], [facility] ONLY) - Abnormal; Notable for the following components:   I-stat hCG, quantitative 325.4 (*)    All other components within normal limits  COMPREHENSIVE METABOLIC PANEL  I-STAT TROPONIN, ED    EKG EKG Interpretation  Date/Time:  Thursday February 22 2018 20:34:47 EDT Ventricular Rate:  100 PR Interval:  142 QRS Duration: 78 QT Interval:  354 QTC Calculation: 456 R Axis:   64 Text Interpretation:  Normal sinus rhythm Nonspecific T wave abnormality Abnormal ECG No old tracing to compare Confirmed by Delora Fuel ([id number]) on 02/22/2018 11:10:52 PM   Radiology Dg Chest 2 View  Result Date: 02/22/2018 CLINICAL DATA:  Central chest pressure radiating to the left side of the neck and back since today. EXAM: CHEST - 2 VIEW COMPARISON:  None. FINDINGS: The heart size and mediastinal contours are within normal limits.  Both lungs are clear. The visualized skeletal structures are unremarkable. IMPRESSION: No active cardiopulmonary disease. Electronically Signed   By: Ashley Royalty M.D.   On: 02/22/2018 21:19    Procedures Procedures (including critical care time)  Medications Ordered in ED Medications  doxycycline (VIBRA-TABS) tablet 100 mg (has no administration in time range)     Initial Impression / Assessment and Plan / ED Course  I have reviewed the triage vital signs and the nursing notes.  Pertinent labs & imaging results that were available during my care of the patient were reviewed by me and considered in my medical decision making (see chart for details).  Doubt pulmonary embolism.  Doubt ACS.  EKG without ischemic changes.  Troponin negative.  No longer with chest discomfort.  Her trapezius and neck pain seems more musculoskeletal nature.  She seems to be under stress and still with grief after her miscarriage.  This may be contributing to some of the neck and shoulder pain.  Primary care follow-up.  Final Clinical Impressions(s) / ED Diagnoses   Final diagnoses:  Precordial chest pain    ED Discharge Orders         Ordered    ibuprofen (ADVIL,MOTRIN) 600 MG tablet  Every 8 hours PRN     02/23/18 0223    cyclobenzaprine (FLEXERIL) 10 MG tablet  3 times daily PRN     02/23/18 0223           Jola Schmidt, MD 02/23/18 [place]    Jola Schmidt, MD 02/23/18 [phone]

## 2018-02-23 NOTE — ED Notes (Signed)
ED Provider at bedside. 

## 2018-04-28 ENCOUNTER — Inpatient Hospital Stay (HOSPITAL_COMMUNITY)
Admission: AD | Admit: 2018-04-28 | Discharge: 2018-04-28 | Disposition: A | Discharge status: Home / Self Care | Payer: Self-pay | Source: Ambulatory Visit | Attending: Obstetrics & Gynecology | Admitting: Obstetrics & Gynecology

## 2018-04-28 ENCOUNTER — Inpatient Hospital Stay (HOSPITAL_COMMUNITY): Payer: Self-pay

## 2018-04-28 ENCOUNTER — Encounter (HOSPITAL_COMMUNITY): Payer: Self-pay | Admitting: *Deleted

## 2018-04-28 DIAGNOSIS — O26891 Other specified pregnancy related conditions, first trimester: Secondary | ICD-10-CM | POA: Insufficient documentation

## 2018-04-28 DIAGNOSIS — R1032 Left lower quadrant pain: Secondary | ICD-10-CM | POA: Insufficient documentation

## 2018-04-28 DIAGNOSIS — Z3A01 Less than 8 weeks gestation of pregnancy: Secondary | ICD-10-CM | POA: Insufficient documentation

## 2018-04-28 DIAGNOSIS — Z880 Allergy status to penicillin: Secondary | ICD-10-CM | POA: Insufficient documentation

## 2018-04-28 DIAGNOSIS — Z3490 Encounter for supervision of normal pregnancy, unspecified, unspecified trimester: Secondary | ICD-10-CM

## 2018-04-28 DIAGNOSIS — R102 Pelvic and perineal pain: Secondary | ICD-10-CM | POA: Insufficient documentation

## 2018-04-28 DIAGNOSIS — O208 Other hemorrhage in early pregnancy: Secondary | ICD-10-CM | POA: Insufficient documentation

## 2018-04-28 LAB — CBC WITH DIFFERENTIAL/PLATELET
BASOS ABS: 0 10*3/uL (ref 0.0–0.1)
Basophils Relative: 0 %
Eosinophils Absolute: 0.1 10*3/uL (ref 0.0–0.5)
Eosinophils Relative: 2 %
HCT: 33.5 % — ABNORMAL LOW (ref 36.0–46.0)
HEMOGLOBIN: 11.4 g/dL — AB (ref 12.0–15.0)
LYMPHS ABS: 1 10*3/uL (ref 0.7–4.0)
LYMPHS PCT: 22 %
MCH: 29.5 pg (ref 26.0–34.0)
MCHC: 34 g/dL (ref 30.0–36.0)
MCV: 86.8 fL (ref 80.0–100.0)
Monocytes Absolute: 0.3 10*3/uL (ref 0.1–1.0)
Monocytes Relative: 6 %
NEUTROS ABS: 3.2 10*3/uL (ref 1.7–7.7)
NEUTROS PCT: 70 %
NRBC: 0 % (ref 0.0–0.2)
PLATELETS: 164 10*3/uL (ref 150–400)
RBC: 3.86 MIL/uL — AB (ref 3.87–5.11)
RDW: 13.9 % (ref 11.5–15.5)
WBC: 4.5 10*3/uL (ref 4.0–10.5)

## 2018-04-28 LAB — HCG, QUANTITATIVE, PREGNANCY: hCG, Beta Chain, Quant, S: 3459 m[IU]/mL — ABNORMAL HIGH (ref <5)

## 2018-04-28 LAB — URINALYSIS, ROUTINE W REFLEX MICROSCOPIC
BACTERIA UA: NONE SEEN
Bilirubin Urine: NEGATIVE
GLUCOSE, UA: NEGATIVE mg/dL
HGB URINE DIPSTICK: NEGATIVE
Ketones, ur: 5 mg/dL — AB
NITRITE: NEGATIVE
PH: 6 (ref 5.0–8.0)
Protein, ur: NEGATIVE mg/dL
SPECIFIC GRAVITY, URINE: 1.011 (ref 1.005–1.030)
WBC, UA: >50 WBC/hpf — ABNORMAL HIGH (ref 0–5)

## 2018-04-28 LAB — POCT PREGNANCY, URINE: Preg Test, Ur: POSITIVE — AB

## 2018-04-28 NOTE — Discharge Instructions (Signed)
First Trimester of Pregnancy The first trimester of pregnancy is from week 1 until the end of week 13 (months 1 through 3). A week after a sperm fertilizes an egg, the egg will implant on the wall of the uterus. This embryo will begin to develop into a baby. Genes from you and your partner will form the baby. The female genes will determine whether the baby will be a boy or a girl. At 6-8 weeks, the eyes and face will be formed, and the heartbeat can be seen on ultrasound. At the end of 12 weeks, all the baby's organs will be formed. Now that you are pregnant, you will want to do everything you can to have a healthy baby. Two of the most important things are to get good prenatal care and to follow your health care provider's instructions. Prenatal care is all the medical care you receive before the baby's birth. This care will help prevent, find, and treat any problems during the pregnancy and childbirth. Body changes during your first trimester Your body goes through many changes during pregnancy. The changes vary from woman to woman.  You may gain or lose a couple of pounds at first.  You may feel sick to your stomach (nauseous) and you may throw up (vomit). If the vomiting is uncontrollable, call your health care provider.  You may tire easily.  You may develop headaches that can be relieved by medicines. All medicines should be approved by your health care provider.  You may urinate more often. Painful urination may mean you have a bladder infection.  You may develop heartburn as a result of your pregnancy.  You may develop constipation because certain hormones are causing the muscles that push stool through your intestines to slow down.  You may develop hemorrhoids or swollen veins (varicose veins).  Your breasts may begin to grow larger and become tender. Your nipples may stick out more, and the tissue that surrounds them (areola) may become darker.  Your gums may bleed and may be  sensitive to brushing and flossing.  Dark spots or blotches (chloasma, mask of pregnancy) may develop on your face. This will likely fade after the baby is born.  Your menstrual periods will stop.  You may have a loss of appetite.  You may develop cravings for certain kinds of food.  You may have changes in your emotions from day to day, such as being excited to be pregnant or being concerned that something may go wrong with the pregnancy and baby.  You may have more vivid and strange dreams.  You may have changes in your hair. These can include thickening of your hair, rapid growth, and changes in texture. Some women also have hair loss during or after pregnancy, or hair that feels dry or thin. Your hair will most likely return to normal after your baby is born.  What to expect at prenatal visits During a routine prenatal visit:  You will be weighed to make sure you and the baby are growing normally.  Your blood pressure will be taken.  Your abdomen will be measured to track your baby's growth.  The fetal heartbeat will be listened to between weeks 10 and 14 of your pregnancy.  Test results from any previous visits will be discussed.  Your health care provider may ask you:  How you are feeling.  If you are feeling the baby move.  If you have had any abnormal symptoms, such as leaking fluid, bleeding, severe headaches,   or abdominal cramping.  If you are using any tobacco products, including cigarettes, chewing tobacco, and electronic cigarettes.  If you have any questions.  Other tests that may be performed during your first trimester include:  Blood tests to find your blood type and to check for the presence of any previous infections. The tests will also be used to check for low iron levels (anemia) and protein on red blood cells (Rh antibodies). Depending on your risk factors, or if you previously had diabetes during pregnancy, you may have tests to check for high blood  sugar that affects pregnant women (gestational diabetes).  Urine tests to check for infections, diabetes, or protein in the urine.  An ultrasound to confirm the proper growth and development of the baby.  Fetal screens for spinal cord problems (spina bifida) and Down syndrome.  HIV (human immunodeficiency virus) testing. Routine prenatal testing includes screening for HIV, unless you choose not to have this test.  You may need other tests to make sure you and the baby are doing well.  Follow these instructions at home: Medicines  Follow your health care provider's instructions regarding medicine use. Specific medicines may be either safe or unsafe to take during pregnancy.  Take a prenatal vitamin that contains at least 600 micrograms (mcg) of folic acid.  If you develop constipation, try taking a stool softener if your health care provider approves. Eating and drinking  Eat a balanced diet that includes fresh fruits and vegetables, whole grains, good sources of protein such as meat, eggs, or tofu, and low-fat dairy. Your health care provider will help you determine the amount of weight gain that is right for you.  Avoid raw meat and uncooked cheese. These carry germs that can cause birth defects in the baby.  Eating four or five small meals rather than three large meals a day may help relieve nausea and vomiting. If you start to feel nauseous, eating a few soda crackers can be helpful. Drinking liquids between meals, instead of during meals, also seems to help ease nausea and vomiting.  Limit foods that are high in fat and processed sugars, such as fried and sweet foods.  To prevent constipation: ? Eat foods that are high in fiber, such as fresh fruits and vegetables, whole grains, and beans. ? Drink enough fluid to keep your urine clear or pale yellow. Activity  Exercise only as directed by your health care provider. Most women can continue their usual exercise routine during  pregnancy. Try to exercise for 30 minutes at least 5 days a week. Exercising will help you: ? Control your weight. ? Stay in shape. ? Be prepared for labor and delivery.  Experiencing pain or cramping in the lower abdomen or lower back is a good sign that you should stop exercising. Check with your health care provider before continuing with normal exercises.  Try to avoid standing for long periods of time. Move your legs often if you must stand in one place for a long time.  Avoid heavy lifting.  Wear low-heeled shoes and practice good posture.  You may continue to have sex unless your health care provider tells you not to. Relieving pain and discomfort  Wear a good support bra to relieve breast tenderness.  Take warm sitz baths to soothe any pain or discomfort caused by hemorrhoids. Use hemorrhoid cream if your health care provider approves.  Rest with your legs elevated if you have leg cramps or low back pain.  If you develop   varicose veins in your legs, wear support hose. Elevate your feet for 15 minutes, 3-4 times a day. Limit salt in your diet. Prenatal care  Schedule your prenatal visits by the twelfth week of pregnancy. They are usually scheduled monthly at first, then more often in the last 2 months before delivery.  Write down your questions. Take them to your prenatal visits.  Keep all your prenatal visits as told by your health care provider. This is important. Safety  Wear your seat belt at all times when driving.  Make a list of emergency phone numbers, including numbers for family, friends, the hospital, and police and fire departments. General instructions  Ask your health care provider for a referral to a local prenatal education class. Begin classes no later than the beginning of month 6 of your pregnancy.  Ask for help if you have counseling or nutritional needs during pregnancy. Your health care provider can offer advice or refer you to specialists for help  with various needs.  Do not use hot tubs, steam rooms, or saunas.  Do not douche or use tampons or scented sanitary pads.  Do not cross your legs for long periods of time.  Avoid cat litter boxes and soil used by cats. These carry germs that can cause birth defects in the baby and possibly loss of the fetus by miscarriage or stillbirth.  Avoid all smoking, herbs, alcohol, and medicines not prescribed by your health care provider. Chemicals in these products affect the formation and growth of the baby.  Do not use any products that contain nicotine or tobacco, such as cigarettes and e-cigarettes. If you need help quitting, ask your health care provider. You may receive counseling support and other resources to help you quit.  Schedule a dentist appointment. At home, brush your teeth with a soft toothbrush and be gentle when you floss. Contact a health care provider if:  You have dizziness.  You have mild pelvic cramps, pelvic pressure, or nagging pain in the abdominal area.  You have persistent nausea, vomiting, or diarrhea.  You have a bad smelling vaginal discharge.  You have pain when you urinate.  You notice increased swelling in your face, hands, legs, or ankles.  You are exposed to fifth disease or chickenpox.  You are exposed to German measles (rubella) and have never had it. Get help right away if:  You have a fever.  You are leaking fluid from your vagina.  You have spotting or bleeding from your vagina.  You have severe abdominal cramping or pain.  You have rapid weight gain or loss.  You vomit blood or material that looks like coffee grounds.  You develop a severe headache.  You have shortness of breath.  You have any kind of trauma, such as from a fall or a car accident. Summary  The first trimester of pregnancy is from week 1 until the end of week 13 (months 1 through 3).  Your body goes through many changes during pregnancy. The changes vary from  woman to woman.  You will have routine prenatal visits. During those visits, your health care provider will examine you, discuss any test results you may have, and talk with you about how you are feeling. This information is not intended to replace advice given to you by your health care provider. Make sure you discuss any questions you have with your health care provider. Document Released: 06/28/2001 Document Revised: 06/15/2016 Document Reviewed: 06/15/2016 Elsevier Interactive Patient Education  2018 [employer]   Inc[employer]  

## 2018-04-28 NOTE — MAU Provider Note (Signed)
History     CSN: [id number]  Arrival date and time: 04/28/18 1416   First Provider Initiated Contact with Patient 04/28/18 1445      Chief Complaint  Patient presents with  . Pelvic Pain   Jennifer Knox is a 27 y.o. [phone] at [redacted]w[redacted]d who presents today with LLQ pain that refers to her shoulder. She thought it was a gas pain, but then felt like she had symptoms of an ectopic pregnancy.   Pelvic Pain  The patient's primary symptoms include pelvic pain. This is a new problem. The current episode started today (around 0930 ). The problem occurs intermittently. The problem has been gradually worsening. Pain severity now: 2/10  The problem affects the left side. She is pregnant. Associated symptoms include diarrhea. Pertinent negatives include no chills, dysuria, fever, frequency, nausea (loose stools this week, but seems resolved now. ), urgency or vomiting. The vaginal discharge was normal. There has been no bleeding. Nothing aggravates the symptoms. She has tried nothing for the symptoms. Her menstrual history has been regular (LMP 03/20/18 ).    OB History    Gravida  4   Para  2   Term  2   Preterm      AB  1   Living  2     SAB  1   TAB      Ectopic      Multiple  0   Live Births  2           Past Medical History:  Diagnosis Date  . Anemia   . GERD (gastroesophageal reflux disease)   . Seizures ([place]) 2006   No seizures since 2008, triggers:lack if sleep, no meds since 2012  . SVD (spontaneous vaginal delivery) 08/2013   [place]    Past Surgical History:  Procedure Laterality Date  . DILATION AND EVACUATION N/A 02/17/2018   Procedure: DILATATION AND EVACUATION;  Surgeon: Sanjuana Kava, MD;  Location: [facility];  Service: Gynecology;  Laterality: N/A;  . LAPAROSCOPY N/A 07/01/2014   Procedure: LAPAROSCOPY OPERATIVE With paratubal cystectomy;  Surgeon: Eldred Manges, MD;  Location: [facility];  Service: Gynecology;  Laterality: N/A;  . WISDOM TOOTH  EXTRACTION      History reviewed. No pertinent family history.  Social History   Tobacco Use  . Smoking status: Never Smoker  . Smokeless tobacco: Never Used  Substance Use Topics  . Alcohol use: No  . Drug use: No    Allergies:  Allergies  Allergen Reactions  . Penicillins Hives    Has patient had a PCN reaction causing immediate rash, facial/tongue/throat swelling, SOB or lightheadedness with hypotension: unknown Has patient had a PCN reaction causing severe rash involving mucus membranes or skin necrosis: unknown Has patient had a PCN reaction that required hospitalization no Has patient had a PCN reaction occurring within the last 10 years: no If all of the above answers are "NO", then may proceed with Cephalosporin use.     Medications Prior to Admission  Medication Sig Dispense Refill Last Dose  . cyclobenzaprine (FLEXERIL) 10 MG tablet Take 1 tablet (10 mg total) by mouth 3 (three) times daily as needed for muscle spasms. 12 tablet 0   . HYDROcodone-acetaminophen (NORCO/VICODIN) 5-325 MG tablet Take 1-2 tablets by mouth every 4 (four) hours as needed for severe pain. 20 tablet 0   . ibuprofen (ADVIL,MOTRIN) 600 MG tablet Take 1 tablet (600 mg total) by mouth every 8 (eight) hours as needed.  15 tablet 0   . Prenatal Vit-Fe Fumarate-FA (PRENATAL MULTIVITAMIN) TABS tablet Take 1 tablet by mouth daily at 12 noon.   11/20/2015 at Unknown time    Review of Systems  Constitutional: Negative for chills and fever.  Gastrointestinal: Positive for diarrhea. Negative for nausea (loose stools this week, but seems resolved now. ) and vomiting.  Genitourinary: Positive for pelvic pain. Negative for decreased urine volume, dysuria, frequency, urgency and vaginal bleeding.   Physical Exam   Blood pressure 115/67, pulse 80, temperature 97.9 F (36.6 C), temperature source Oral, resp. rate 17, weight 53.1 kg, last menstrual period 03/20/2018, SpO2 99 %, unknown if currently  breastfeeding.  Physical Exam  Nursing note and vitals reviewed. Constitutional: She is oriented to person, place, and time. She appears well-developed and well-nourished. No distress.  HENT:  Head: Normocephalic.  Cardiovascular: Normal rate.  Respiratory: Effort normal.  GI: Soft. There is no tenderness. There is no rebound.  Neurological: She is alert and oriented to person, place, and time.  Skin: Skin is warm.  Psychiatric: She has a normal mood and affect.   Results for orders placed or performed during the hospital encounter of 04/28/18 (from the past 24 hour(s))  Urinalysis, Routine w reflex microscopic     Status: Abnormal   Collection Time: 04/28/18  2:32 PM  Result Value Ref Range   Color, Urine YELLOW YELLOW   APPearance CLOUDY (A) CLEAR   Specific Gravity, Urine 1.011 1.005 - 1.030   pH 6.0 5.0 - 8.0   Glucose, UA NEGATIVE NEGATIVE mg/dL   Hgb urine dipstick NEGATIVE NEGATIVE   Bilirubin Urine NEGATIVE NEGATIVE   Ketones, ur 5 (A) NEGATIVE mg/dL   Protein, ur NEGATIVE NEGATIVE mg/dL   Nitrite NEGATIVE NEGATIVE   Leukocytes, UA LARGE (A) NEGATIVE   RBC / HPF 0-5 0 - 5 RBC/hpf   WBC, UA >50 (H) 0 - 5 WBC/hpf   Bacteria, UA NONE SEEN NONE SEEN   Squamous Epithelial / LPF 11-20 0 - 5   Mucus PRESENT    Budding Yeast PRESENT   Pregnancy, urine POC     Status: Abnormal   Collection Time: 04/28/18  2:34 PM  Result Value Ref Range   Preg Test, Ur POSITIVE (A) NEGATIVE  hCG, quantitative, pregnancy     Status: Abnormal   Collection Time: 04/28/18  3:07 PM  Result Value Ref Range   hCG, Beta Chain, Quant, S 3,459 (H) <5 mIU/mL  CBC with Differential/Platelet     Status: Abnormal   Collection Time: 04/28/18  3:07 PM  Result Value Ref Range   WBC 4.5 4.0 - 10.5 K/uL   RBC 3.86 (L) 3.87 - 5.11 MIL/uL   Hemoglobin 11.4 (L) 12.0 - 15.0 g/dL   HCT 33.5 (L) 36.0 - 46.0 %   MCV 86.8 80.0 - 100.0 fL   MCH 29.5 26.0 - 34.0 pg   MCHC 34.0 30.0 - 36.0 g/dL   RDW 13.9 11.5  - 15.5 %   Platelets 164 150 - 400 K/uL   nRBC 0.0 0.0 - 0.2 %   Neutrophils Relative % 70 %   Neutro Abs 3.2 1.7 - 7.7 K/uL   Lymphocytes Relative 22 %   Lymphs Abs 1.0 0.7 - 4.0 K/uL   Monocytes Relative 6 %   Monocytes Absolute 0.3 0.1 - 1.0 K/uL   Eosinophils Relative 2 %   Eosinophils Absolute 0.1 0.0 - 0.5 K/uL   Basophils Relative 0 %   Basophils Absolute  0.0 0.0 - 0.1 K/uL   US Ob Comp Less 14 Wks  Result Date: 04/28/2018 CLINICAL DATA:  Pregnant patient with pelvic pain. EXAM: OBSTETRIC <14 WK [place] AND TRANSVAGINAL OB US TECHNIQUE: Both transabdominal and transvaginal ultrasound examinations were performed for complete evaluation of the gestation as well as the maternal uterus, adnexal regions, and pelvic cul-de-sac. Transvaginal technique was performed to assess early pregnancy. COMPARISON:  Pelvic ultrasound 04/28/2018 FINDINGS: Intrauterine gestational sac: Single Yolk sac:  Visualized. Embryo:  Not Visualized. Cardiac Activity: Not Visualized. MSD: 6.3 mm   5 w   2 d Subchorionic hemorrhage:  Small Maternal uterus/adnexae: Normal right and left ovaries. Left ovarian corpus luteum. No free fluid in the pelvis. IMPRESSION: Probable early intrauterine gestational sac and yolk sac but no fetal pole or cardiac activity yet visualized. Recommend follow-up quantitative B-HCG levels and follow-up US in 14 days to assess viability. This recommendation follows SRU consensus guidelines: Diagnostic Criteria for Nonviable Pregnancy Early in the First Trimester. [place] Med 2013; 818:2993-71. Small subchorionic hemorrhage. Electronically Signed   By: Lovey Newcomer M.D.   On: 04/28/2018 16:09   US Ob Transvaginal  Result Date: 04/28/2018 CLINICAL DATA:  Pregnant patient with pelvic pain. EXAM: OBSTETRIC <14 WK [place] AND TRANSVAGINAL OB US TECHNIQUE: Both transabdominal and transvaginal ultrasound examinations were performed for complete evaluation of the gestation as well as the maternal uterus,  adnexal regions, and pelvic cul-de-sac. Transvaginal technique was performed to assess early pregnancy. COMPARISON:  Pelvic ultrasound 04/28/2018 FINDINGS: Intrauterine gestational sac: Single Yolk sac:  Visualized. Embryo:  Not Visualized. Cardiac Activity: Not Visualized. MSD: 6.3 mm   5 w   2 d Subchorionic hemorrhage:  Small Maternal uterus/adnexae: Normal right and left ovaries. Left ovarian corpus luteum. No free fluid in the pelvis. IMPRESSION: Probable early intrauterine gestational sac and yolk sac but no fetal pole or cardiac activity yet visualized. Recommend follow-up quantitative B-HCG levels and follow-up US in 14 days to assess viability. This recommendation follows SRU consensus guidelines: Diagnostic Criteria for Nonviable Pregnancy Early in the First Trimester. [employer] Med 2013; 696:7893-81. Small subchorionic hemorrhage. Electronically Signed   By: Lovey Newcomer M.D.   On: 04/28/2018 16:09    MAU Course  Procedures  MDM 4:23 PM Ranee Gosselin, CNM notified that patient needs FU appt in 10 days   Assessment and Plan   1. Pelvic pain in pregnancy, antepartum, first trimester   2. [redacted] weeks gestation of pregnancy   3. Intrauterine pregnancy    DC home Comfort measures reviewed  1st Trimester precautions  Bleeding precautions RX: none  Return to MAU as needed FU with OB in about 10 days, the office is aware.   Follow-up [facility] Follow up.   Specialty:  Obstetrics and Gynecology Contact information: [address] [place] [address] [phone]           Marcille Buffy 04/28/2018, 2:47 PM

## 2018-04-28 NOTE — MAU Note (Signed)
Jennifer Knox is a 27 y.o. here in MAU reporting: that shes [redacted]w[redacted]d along. Having +left sided abdominal and shoulder pain. Intermittent and sharp. LMP: 03/20/18 Onset of complaint: started today Pain score: 8/10. Has not taken anything for the pain. Denies vaginal bleeding or discharge. Endorses a miscarriage in august. Vitals:   04/28/18 1430  BP: 115/67  Pulse: 80  Resp: 17  Temp: 97.9 F (36.6 C)  SpO2: 99%    Lab orders placed from triage: ua and pregnancy test

## 2018-05-02 ENCOUNTER — Inpatient Hospital Stay (HOSPITAL_COMMUNITY)
Admission: AD | Admit: 2018-05-02 | Discharge: 2018-05-02 | Disposition: A | Discharge status: Home / Self Care | Payer: Self-pay | Source: Ambulatory Visit | Attending: Obstetrics & Gynecology | Admitting: Obstetrics & Gynecology

## 2018-05-02 ENCOUNTER — Encounter (HOSPITAL_COMMUNITY): Payer: Self-pay

## 2018-05-02 ENCOUNTER — Inpatient Hospital Stay (HOSPITAL_COMMUNITY): Payer: Self-pay

## 2018-05-02 DIAGNOSIS — O209 Hemorrhage in early pregnancy, unspecified: Secondary | ICD-10-CM

## 2018-05-02 DIAGNOSIS — O468X1 Other antepartum hemorrhage, first trimester: Secondary | ICD-10-CM | POA: Insufficient documentation

## 2018-05-02 DIAGNOSIS — Z880 Allergy status to penicillin: Secondary | ICD-10-CM | POA: Insufficient documentation

## 2018-05-02 DIAGNOSIS — K219 Gastro-esophageal reflux disease without esophagitis: Secondary | ICD-10-CM | POA: Insufficient documentation

## 2018-05-02 DIAGNOSIS — Z3A01 Less than 8 weeks gestation of pregnancy: Secondary | ICD-10-CM | POA: Insufficient documentation

## 2018-05-02 DIAGNOSIS — O200 Threatened abortion: Secondary | ICD-10-CM | POA: Insufficient documentation

## 2018-05-02 DIAGNOSIS — O4691 Antepartum hemorrhage, unspecified, first trimester: Secondary | ICD-10-CM | POA: Insufficient documentation

## 2018-05-02 DIAGNOSIS — R109 Unspecified abdominal pain: Secondary | ICD-10-CM | POA: Insufficient documentation

## 2018-05-02 DIAGNOSIS — O09291 Supervision of pregnancy with other poor reproductive or obstetric history, first trimester: Secondary | ICD-10-CM | POA: Insufficient documentation

## 2018-05-02 DIAGNOSIS — O418X10 Other specified disorders of amniotic fluid and membranes, first trimester, not applicable or unspecified: Secondary | ICD-10-CM

## 2018-05-02 DIAGNOSIS — O360910 Maternal care for other rhesus isoimmunization, first trimester, not applicable or unspecified: Secondary | ICD-10-CM | POA: Insufficient documentation

## 2018-05-02 DIAGNOSIS — Z6791 Unspecified blood type, Rh negative: Secondary | ICD-10-CM

## 2018-05-02 DIAGNOSIS — O99611 Diseases of the digestive system complicating pregnancy, first trimester: Secondary | ICD-10-CM | POA: Insufficient documentation

## 2018-05-02 DIAGNOSIS — O26891 Other specified pregnancy related conditions, first trimester: Secondary | ICD-10-CM | POA: Insufficient documentation

## 2018-05-02 MED ORDER — RHO D IMMUNE GLOBULIN 1500 UNIT/2ML IJ SOSY
300.0000 ug | PREFILLED_SYRINGE | Freq: Once | INTRAMUSCULAR | Status: AC
  Administered 2018-05-02: 300 ug via INTRAMUSCULAR
  Filled 2018-05-02: qty 2

## 2018-05-02 NOTE — MAU Note (Signed)
Urine in lab 

## 2018-05-02 NOTE — MAU Note (Signed)
Pt having some scant bleeding and cramping. Was seen on MAU on Saturday

## 2018-05-02 NOTE — Discharge Instructions (Signed)
Subchorionic Hematoma A subchorionic hematoma is a gathering of blood between the outer wall of the placenta and the inner wall of the womb (uterus). The placenta is the organ that connects the fetus to the wall of the uterus. The placenta performs the feeding, breathing (oxygen to the fetus), and waste removal (excretory work) of the fetus. Subchorionic hematoma is the most common abnormality found on a result from ultrasonography done during the first trimester or early second trimester of pregnancy. If there has been little or no vaginal bleeding, early small hematomas usually shrink on their own and do not affect your baby or pregnancy. The blood is gradually absorbed over 1-2 weeks. When bleeding starts later in pregnancy or the hematoma is larger or occurs in an older pregnant woman, the outcome may not be as good. Larger hematomas may get bigger, which increases the chances for miscarriage. Subchorionic hematoma also increases the risk of premature detachment of the placenta from the uterus, preterm (premature) labor, and stillbirth. Follow these instructions at home:  Stay on bed rest if your health care provider recommends this. Although bed rest will not prevent more bleeding or prevent a miscarriage, your health care provider may recommend bed rest until you are advised otherwise.  Avoid heavy lifting (more than 10 lb [4.5 kg]), exercise, sexual intercourse, or douching as directed by your health care provider.  Keep track of the number of pads you use each day and how soaked (saturated) they are. Write down this information.  Do not use tampons.  Keep all follow-up appointments as directed by your health care provider. Your health care provider may ask you to have follow-up blood tests or ultrasound tests or both. Get help right away if:  You have severe cramps in your stomach, back, abdomen, or pelvis.  You have a fever.  You pass large clots or tissue. Save any tissue for your  health care provider to look at.  Your bleeding increases or you become lightheaded, feel weak, or have fainting episodes. This information is not intended to replace advice given to you by your health care provider. Make sure you discuss any questions you have with your health care provider. Document Released: 10/19/2006 Document Revised: 12/10/2015 Document Reviewed: 01/31/2013 Elsevier Interactive Patient Education  2017 [place]. Pelvic Rest Pelvic rest may be recommended if:  Your placenta is partially or completely covering the opening of your cervix (placenta previa).  There is bleeding between the wall of the uterus and the amniotic sac in the first trimester of pregnancy (subchorionic hemorrhage).  You went into labor too early (preterm labor).  Based on your overall health and the health of your baby, your health care provider will decide if pelvic rest is right for you. How do I rest my pelvis? For as long as told by your health care provider:  Do not have sex, sexual stimulation, or an orgasm.  Do not use tampons. Do not douche. Do not put anything in your vagina.  Do not lift anything that is heavier than 10 lb (4.5 kg).  Avoid activities that take a lot of effort (are strenuous).  Avoid any activity in which your pelvic muscles could become strained.  When should I seek medical care? Seek medical care if you have:  Cramping pain in your lower abdomen.  Vaginal discharge.  A low, dull backache.  Regular contractions.  Uterine tightening.  When should I seek immediate medical care? Seek immediate medical care if:  You have vaginal bleeding  and you are pregnant.  This information is not intended to replace advice given to you by your health care provider. Make sure you discuss any questions you have with your health care provider. Document Released: 10/29/2010 Document Revised: 12/10/2015 Document Reviewed: 01/05/2015 Elsevier Interactive Patient  Education  [employer].

## 2018-05-02 NOTE — MAU Provider Note (Signed)
History     CSN: [id number]  Arrival date and time: 05/02/18 4854   First Provider Initiated Contact with Patient 05/02/18 1901      Chief Complaint  Patient presents with  . Abdominal Pain  . Vaginal Bleeding   HPI   [name] is a 27 y.o. female [phone] @ [redacted]w[redacted]d with a history of recent miscarriage,  here with vaginal bleeding. This is a new pregnancy.  The bleeding started yesterday. The bleeding is light. Today the bleeding became red and it was a significant amount compared to what it was yesterday.  The bleeding waxes and wanes and has most of the day. She was seen here in MAU on Saturday with abdominal pain. The pain is better and did not become worse during the week.   She is a patient of [facility].   OB History    Gravida  4   Para  2   Term  2   Preterm      AB  1   Living  2     SAB  1   TAB      Ectopic      Multiple  0   Live Births  2           Past Medical History:  Diagnosis Date  . Anemia   . GERD (gastroesophageal reflux disease)   . Seizures ([place]) 2006   No seizures since 2008, triggers:lack if sleep, no meds since 2012  . SVD (spontaneous vaginal delivery) 08/2013   x 1 [place]    Past Surgical History:  Procedure Laterality Date  . DILATION AND EVACUATION N/A 02/17/2018   Procedure: DILATATION AND EVACUATION;  Surgeon: Sanjuana Kava, MD;  Location: [facility];  Service: Gynecology;  Laterality: N/A;  . LAPAROSCOPY N/A 07/01/2014   Procedure: LAPAROSCOPY OPERATIVE With paratubal cystectomy;  Surgeon: Eldred Manges, MD;  Location: [facility];  Service: Gynecology;  Laterality: N/A;  . WISDOM TOOTH EXTRACTION      No family history on file.  Social History   Tobacco Use  . Smoking status: Never Smoker  . Smokeless tobacco: Never Used  Substance Use Topics  . Alcohol use: No  . Drug use: No    Allergies:  Allergies  Allergen Reactions  . Penicillins Hives    Has patient had a PCN reaction causing immediate rash,  facial/tongue/throat swelling, SOB or lightheadedness with hypotension: unknown Has patient had a PCN reaction causing severe rash involving mucus membranes or skin necrosis: unknown Has patient had a PCN reaction that required hospitalization no Has patient had a PCN reaction occurring within the last 10 years: no If all of the above answers are "NO", then may proceed with Cephalosporin use.     Medications Prior to Admission  Medication Sig Dispense Refill Last Dose  . cyclobenzaprine (FLEXERIL) 10 MG tablet Take 1 tablet (10 mg total) by mouth 3 (three) times daily as needed for muscle spasms. 12 tablet 0   . Prenatal Vit-Fe Fumarate-FA (PRENATAL MULTIVITAMIN) TABS tablet Take 1 tablet by mouth daily at 12 noon.   11/20/2015 at Unknown time   Results for orders placed or performed during the hospital encounter of 05/02/18 (from the past 48 hour(s))  Rh IG workup (includes ABO/Rh)     Status: None (Preliminary result)   Collection Time: 05/02/18  7:16 PM  Result Value Ref Range   Gestational Age(Wks) 6    ABO/RH(D) O NEG    Antibody Screen POS  Antibody Identification PASSIVELY ACQUIRED ANTI-D    Unit Number [id number]    Blood Component Type RHIG    Unit division 00    Status of Unit ISSUED    Transfusion Status      OK TO TRANSFUSE Performed at [facility], [address], [place], [place] [address]    US Ob Transvaginal  Result Date: 05/02/2018 CLINICAL DATA:  Intermittent bleeding EXAM: TRANSVAGINAL OB ULTRASOUND TECHNIQUE: Transvaginal ultrasound was performed for complete evaluation of the gestation as well as the maternal uterus, adnexal regions, and pelvic cul-de-sac. COMPARISON:  04/28/2018 FINDINGS: Intrauterine gestational sac: Single intrauterine pregnancy Yolk sac:  Visible Embryo:  Visible Cardiac Activity: Visible Heart Rate: 119 bpm CRL: 2.8 mm   5 w 5 d                  [place] EDC: 12/28/2018 Subchorionic hemorrhage:  Moderate subchorionic hemorrhage Maternal  uterus/adnexae: Ovaries are within normal limits. Left ovary measures 2.5 x 4.8 x 2.5 cm and contains a corpus luteum. Right ovary measures 4.7 x 1.9 x 2.3 cm. No significant free fluid. IMPRESSION: 1. Single viable intrauterine pregnancy as above 2. Moderate subchorionic hemorrhage Electronically Signed   By: Donavan Foil M.D.   On: 05/02/2018 20:46   Review of Systems  Constitutional: Negative for fever.  Gastrointestinal: Negative for abdominal pain.  Genitourinary: Positive for vaginal bleeding.   Physical Exam   Blood pressure 106/64, pulse 82, temperature 98.7 F (37.1 C), temperature source Oral, resp. rate 16, weight 53.1 kg, last menstrual period 03/20/2018, unknown if currently breastfeeding.  Physical Exam  Constitutional: She is oriented to person, place, and time. She appears well-developed and well-nourished. No distress.  HENT:  Head: Normocephalic.  GI: Soft. She exhibits no distension. There is no tenderness. There is no rebound and no guarding.  Musculoskeletal: Normal range of motion.  Neurological: She is alert and oriented to person, place, and time.  Skin: Skin is warm. She is not diaphoretic.  Psychiatric: Her behavior is normal.   MAU Course  Procedures  None  MDM  O negative blood type: Rhogam given.  Patient with a lot of questions today. Discussed [place] In detail with the patient and her husband. Discussed subchorionic hemorrhage.  Spoke to [place] with [facility]. Patient should be called this week by the office for follow up.   Assessment and Plan   A:  1. [redacted] weeks gestation of pregnancy   2. Vaginal bleeding in pregnancy, first trimester   3. Subchorionic hematoma in first trimester   4. Rh negative state in antepartum period, first trimester   5. Threatened miscarriage in early pregnancy     P:  Discharge home in stable condition Pelvic rest Bleeding precautions Return to MAU if symptoms worsen Picture provided F/U with ob  Support  given  Noni Saupe I, NP 05/03/2018 8:10 AM

## 2018-05-03 LAB — RH IG WORKUP (INCLUDES ABO/RH)
ABO/RH(D): O NEG
Antibody Screen: POSITIVE
Gestational Age(Wks): 6
UNIT DIVISION: 0

## 2018-05-05 ENCOUNTER — Encounter (HOSPITAL_COMMUNITY): Payer: Self-pay | Admitting: Student

## 2018-05-05 ENCOUNTER — Inpatient Hospital Stay (HOSPITAL_COMMUNITY)
Admission: AD | Admit: 2018-05-05 | Discharge: 2018-05-05 | Disposition: A | Discharge status: Home / Self Care | Payer: Self-pay | Source: Ambulatory Visit | Attending: Obstetrics and Gynecology | Admitting: Obstetrics and Gynecology

## 2018-05-05 ENCOUNTER — Other Ambulatory Visit: Payer: Self-pay | Admitting: Obstetrics and Gynecology

## 2018-05-05 ENCOUNTER — Inpatient Hospital Stay (HOSPITAL_COMMUNITY): Payer: Self-pay

## 2018-05-05 DIAGNOSIS — O209 Hemorrhage in early pregnancy, unspecified: Secondary | ICD-10-CM | POA: Insufficient documentation

## 2018-05-05 DIAGNOSIS — Z5329 Procedure and treatment not carried out because of patient's decision for other reasons: Secondary | ICD-10-CM | POA: Insufficient documentation

## 2018-05-05 DIAGNOSIS — Z3A01 Less than 8 weeks gestation of pregnancy: Secondary | ICD-10-CM | POA: Insufficient documentation

## 2018-05-05 NOTE — MAU Note (Signed)
Patient approached registration with desire to sign AMA form.  RN went over risks associated with her condition and encouraged patient to return whenever needed.  Patient signed AMA form and form was placed in the chart.  Provider notified.

## 2018-05-05 NOTE — MAU Note (Signed)
Here earlier this wk and have subchorionic hemorrhage. Today bleeding more with clots and more cramping

## 2019-03-19 ENCOUNTER — Encounter (HOSPITAL_COMMUNITY): Payer: Self-pay

## 2019-04-04 LAB — OB RESULTS CONSOLE GC/CHLAMYDIA
Chlamydia: NEGATIVE
Gonorrhea: NEGATIVE

## 2019-04-04 LAB — OB RESULTS CONSOLE RUBELLA ANTIBODY, IGM: Rubella: IMMUNE

## 2019-04-04 LAB — OB RESULTS CONSOLE RPR: RPR: NONREACTIVE

## 2019-04-04 LAB — OB RESULTS CONSOLE HEPATITIS B SURFACE ANTIGEN: Hepatitis B Surface Ag: NEGATIVE

## 2019-04-04 LAB — OB RESULTS CONSOLE HIV ANTIBODY (ROUTINE TESTING): HIV: NONREACTIVE

## 2019-07-19 NOTE — L&D Delivery Note (Signed)
Delivery Note:   [id number] at [redacted]w[redacted]d  Admitting diagnosis: Encounter for planned induction of labor Risks:  Fetal arrhythmia 28 wks, normal fetal echo Hx seizure disorder, stab;e off meds since 2008 Rh negative, Rhogam at 30 wks 08/14/2019 Hx SAB x 2, D&C Hx peritoneal cyst, lap 2015  Onset of labor: active labor 1400 Augmentation: Pitocin ROM: 1544 Used nitrous gas for transition stage.  Complete dilation at 10/29/2019  1544 Onset of pushing at 1550 FHR second stage Cat 1  Analgesia /Anesthesia intrapartum:None  Pushing in L lateral position position with CNM and L&D staff support, Catalina Antigua (spouse) present for birth and supportive.  Delivery of a Live born female  Weight: 3827 g Weight:, English: 8 lb 7 oz APGAR: 9, 10  Newborn Delivery   Birth date/time: 10/29/2019 15:55:00 Delivery type: Vaginal, Spontaneous      in cephalic presentation, position OA to ROT.  Nuchal Cord: No  Cord double clamped after cessation of pulsation, cut by [facility].  Collection of cord blood for typing completed. Cord blood donation-None  Arterial cord blood sample-No    Placenta delivered-Spontaneous  with 3 vessels . Eccentric cord insertion Uterotonics: Pitocin IV bolus Placenta to L&D for disposal. Uterine tone firm, bleeding small  None  laceration identified. Small perineal abrasion hemostatic Episiotomy:None   Est. Blood Loss ([id number]   Complications: None  Nevus on thick stalk removed from mons pubis per maternal request, site prepped w/ betadine, 1% lido infiltrated at base of lesion, used 2.0 vicryl for ligation, stalk incised w/ scalpel and removed, remaining area placed several small baseball stitches for approximation, good hemostasis.  Band-Aid applied.  Specimen to pathology per patient agreement.   Mom to postpartum.  Baby "Raquel Sarna" to Couplet care / Skin to Skin.  Delivery Report:  Review the Delivery Report for details.     Signed: Juliene Pina, CNM, MSN 10/29/2019,  4:26 PM

## 2019-10-28 ENCOUNTER — Telehealth (HOSPITAL_COMMUNITY): Payer: Self-pay | Admitting: *Deleted

## 2019-10-28 ENCOUNTER — Other Ambulatory Visit (HOSPITAL_COMMUNITY)
Admission: RE | Admit: 2019-10-28 | Discharge: 2019-10-28 | Disposition: A | Discharge status: Home / Self Care | Payer: HRSA Program | Source: Ambulatory Visit | Attending: Obstetrics & Gynecology | Admitting: Obstetrics & Gynecology

## 2019-10-28 ENCOUNTER — Encounter (HOSPITAL_COMMUNITY): Payer: Self-pay | Admitting: *Deleted

## 2019-10-28 ENCOUNTER — Other Ambulatory Visit: Payer: Self-pay | Admitting: Obstetrics & Gynecology

## 2019-10-28 DIAGNOSIS — Z01812 Encounter for preprocedural laboratory examination: Secondary | ICD-10-CM | POA: Diagnosis present

## 2019-10-28 DIAGNOSIS — Z20822 Contact with and (suspected) exposure to covid-19: Secondary | ICD-10-CM | POA: Insufficient documentation

## 2019-10-28 LAB — SARS CORONAVIRUS 2 (TAT 6-24 HRS): SARS Coronavirus 2: NEGATIVE

## 2019-10-28 NOTE — Telephone Encounter (Signed)
Preadmission screen  

## 2019-10-29 ENCOUNTER — Inpatient Hospital Stay (HOSPITAL_COMMUNITY): Payer: Self-pay

## 2019-10-29 ENCOUNTER — Other Ambulatory Visit: Payer: Self-pay

## 2019-10-29 ENCOUNTER — Inpatient Hospital Stay (HOSPITAL_COMMUNITY)
Admission: AD | Admit: 2019-10-29 | Discharge: 2019-10-30 | DRG: 807 | Disposition: A | Discharge status: Home / Self Care | Payer: Self-pay | Attending: Obstetrics & Gynecology | Admitting: Obstetrics & Gynecology

## 2019-10-29 ENCOUNTER — Encounter (HOSPITAL_COMMUNITY): Payer: Self-pay | Admitting: Obstetrics & Gynecology

## 2019-10-29 DIAGNOSIS — L299 Pruritus, unspecified: Secondary | ICD-10-CM | POA: Diagnosis present

## 2019-10-29 DIAGNOSIS — O43123 Velamentous insertion of umbilical cord, third trimester: Secondary | ICD-10-CM | POA: Diagnosis present

## 2019-10-29 DIAGNOSIS — O26899 Other specified pregnancy related conditions, unspecified trimester: Secondary | ICD-10-CM

## 2019-10-29 DIAGNOSIS — Z6791 Unspecified blood type, Rh negative: Secondary | ICD-10-CM

## 2019-10-29 DIAGNOSIS — Z3A40 40 weeks gestation of pregnancy: Secondary | ICD-10-CM

## 2019-10-29 DIAGNOSIS — Z3490 Encounter for supervision of normal pregnancy, unspecified, unspecified trimester: Secondary | ICD-10-CM | POA: Diagnosis present

## 2019-10-29 DIAGNOSIS — O480 Post-term pregnancy: Principal | ICD-10-CM | POA: Diagnosis present

## 2019-10-29 DIAGNOSIS — O26893 Other specified pregnancy related conditions, third trimester: Secondary | ICD-10-CM | POA: Diagnosis present

## 2019-10-29 LAB — CBC
HCT: 34.8 % — ABNORMAL LOW (ref 36.0–46.0)
Hemoglobin: 11.7 g/dL — ABNORMAL LOW (ref 12.0–15.0)
MCH: 31.2 pg (ref 26.0–34.0)
MCHC: 33.6 g/dL (ref 30.0–36.0)
MCV: 92.8 fL (ref 80.0–100.0)
Platelets: 194 10*3/uL (ref 150–400)
RBC: 3.75 MIL/uL — ABNORMAL LOW (ref 3.87–5.11)
RDW: 14.4 % (ref 11.5–15.5)
WBC: 7.7 10*3/uL (ref 4.0–10.5)
nRBC: 0 % (ref 0.0–0.2)

## 2019-10-29 LAB — COMPREHENSIVE METABOLIC PANEL
ALT: 15 U/L (ref 0–44)
AST: 27 U/L (ref 15–41)
Albumin: 2.9 g/dL — ABNORMAL LOW (ref 3.5–5.0)
Alkaline Phosphatase: 152 U/L — ABNORMAL HIGH (ref 38–126)
Anion gap: 10 (ref 5–15)
BUN: 8 mg/dL (ref 6–20)
CO2: 22 mmol/L (ref 22–32)
Calcium: 8.9 mg/dL (ref 8.9–10.3)
Chloride: 104 mmol/L (ref 98–111)
Creatinine, Ser: 0.69 mg/dL (ref 0.44–1.00)
GFR calc Af Amer: >60 mL/min (ref >60)
GFR calc non Af Amer: >60 mL/min (ref >60)
Glucose, Bld: 140 mg/dL — ABNORMAL HIGH (ref 70–99)
Potassium: 3.4 mmol/L — ABNORMAL LOW (ref 3.5–5.1)
Sodium: 136 mmol/L (ref 135–145)
Total Bilirubin: 0.8 mg/dL (ref 0.3–1.2)
Total Protein: 6.2 g/dL — ABNORMAL LOW (ref 6.5–8.1)

## 2019-10-29 LAB — RPR: RPR Ser Ql: NONREACTIVE

## 2019-10-29 LAB — TYPE AND SCREEN
ABO/RH(D): O NEG
Antibody Screen: POSITIVE

## 2019-10-29 MED ORDER — DIBUCAINE (PERIANAL) 1 % EX OINT: 1.0000 "application " | TOPICAL_OINTMENT | CUTANEOUS | Status: DC | PRN

## 2019-10-29 MED ORDER — DIPHENHYDRAMINE HCL 50 MG/ML IJ SOLN: 12.5000 mg | INTRAMUSCULAR | Status: DC | PRN

## 2019-10-29 MED ORDER — ACETAMINOPHEN 325 MG PO TABS: 650.0000 mg | ORAL_TABLET | ORAL | Status: DC | PRN

## 2019-10-29 MED ORDER — MISOPROSTOL 25 MCG QUARTER TABLET
25.0000 ug | ORAL_TABLET | ORAL | Status: DC | PRN
  Filled 2019-10-29: qty 1

## 2019-10-29 MED ORDER — LIDOCAINE HCL (PF) 1 % IJ SOLN
30.0000 mL | INTRAMUSCULAR | Status: AC | PRN
  Administered 2019-10-29: 30 mL via SUBCUTANEOUS
  Filled 2019-10-29: qty 30

## 2019-10-29 MED ORDER — LACTATED RINGERS IV SOLN: 500.0000 mL | INTRAVENOUS | Status: DC | PRN

## 2019-10-29 MED ORDER — TERBUTALINE SULFATE 1 MG/ML IJ SOLN: 0.2500 mg | Freq: Once | INTRAMUSCULAR | Status: DC | PRN

## 2019-10-29 MED ORDER — ONDANSETRON HCL 4 MG/2ML IJ SOLN: 4.0000 mg | Freq: Four times a day (QID) | INTRAMUSCULAR | Status: DC | PRN

## 2019-10-29 MED ORDER — ZOLPIDEM TARTRATE 5 MG PO TABS: 5.0000 mg | ORAL_TABLET | Freq: Every evening | ORAL | Status: DC | PRN

## 2019-10-29 MED ORDER — OXYTOCIN BOLUS FROM INFUSION
500.0000 mL | Freq: Once | INTRAVENOUS | Status: AC
  Administered 2019-10-29: 500 mL via INTRAVENOUS

## 2019-10-29 MED ORDER — FLEET ENEMA 7-19 GM/118ML RE ENEM: 1.0000 | ENEMA | Freq: Every day | RECTAL | Status: DC | PRN

## 2019-10-29 MED ORDER — FLEET ENEMA 7-19 GM/118ML RE ENEM: 1.0000 | ENEMA | RECTAL | Status: DC | PRN

## 2019-10-29 MED ORDER — ONDANSETRON HCL 4 MG PO TABS: 4.0000 mg | ORAL_TABLET | ORAL | Status: DC | PRN

## 2019-10-29 MED ORDER — SENNOSIDES-DOCUSATE SODIUM 8.6-50 MG PO TABS
2.0000 | ORAL_TABLET | ORAL | Status: DC
  Administered 2019-10-29: 2 via ORAL
  Filled 2019-10-29: qty 2

## 2019-10-29 MED ORDER — EPHEDRINE 5 MG/ML INJ: 10.0000 mg | INTRAVENOUS | Status: DC | PRN

## 2019-10-29 MED ORDER — OXYTOCIN 40 UNITS IN NORMAL SALINE INFUSION - SIMPLE MED
1.0000 m[IU]/min | INTRAVENOUS | Status: DC
  Filled 2019-10-29: qty 1000

## 2019-10-29 MED ORDER — FENTANYL CITRATE (PF) 100 MCG/2ML IJ SOLN: 50.0000 ug | INTRAMUSCULAR | Status: DC | PRN

## 2019-10-29 MED ORDER — OXYTOCIN 40 UNITS IN NORMAL SALINE INFUSION - SIMPLE MED
2.5000 [IU]/h | INTRAVENOUS | Status: DC
  Administered 2019-10-29: 2.5 [IU]/h via INTRAVENOUS

## 2019-10-29 MED ORDER — BENZOCAINE-MENTHOL 20-0.5 % EX AERO: 1.0000 "application " | INHALATION_SPRAY | CUTANEOUS | Status: DC | PRN

## 2019-10-29 MED ORDER — PHENYLEPHRINE 40 MCG/ML (10ML) SYRINGE FOR IV PUSH (FOR BLOOD PRESSURE SUPPORT): 80.0000 ug | PREFILLED_SYRINGE | INTRAVENOUS | Status: DC | PRN

## 2019-10-29 MED ORDER — DIPHENHYDRAMINE HCL 25 MG PO CAPS: 25.0000 mg | ORAL_CAPSULE | Freq: Four times a day (QID) | ORAL | Status: DC | PRN

## 2019-10-29 MED ORDER — COCONUT OIL OIL: 1.0000 "application " | TOPICAL_OIL | Status: DC | PRN

## 2019-10-29 MED ORDER — WITCH HAZEL-GLYCERIN EX PADS: 1.0000 "application " | MEDICATED_PAD | CUTANEOUS | Status: DC | PRN

## 2019-10-29 MED ORDER — BISACODYL 10 MG RE SUPP: 10.0000 mg | Freq: Every day | RECTAL | Status: DC | PRN

## 2019-10-29 MED ORDER — LACTATED RINGERS IV BOLUS: 500.0000 mL | Freq: Once | INTRAVENOUS | Status: DC

## 2019-10-29 MED ORDER — SOD CITRATE-CITRIC ACID 500-334 MG/5ML PO SOLN: 30.0000 mL | ORAL | Status: DC | PRN

## 2019-10-29 MED ORDER — LACTATED RINGERS IV SOLN: 500.0000 mL | Freq: Once | INTRAVENOUS | Status: DC

## 2019-10-29 MED ORDER — LACTATED RINGERS IV SOLN: INTRAVENOUS | Status: DC

## 2019-10-29 MED ORDER — FENTANYL-BUPIVACAINE-NACL 0.5-0.125-0.9 MG/250ML-% EP SOLN: 12.0000 mL/h | EPIDURAL | Status: DC | PRN

## 2019-10-29 MED ORDER — OXYTOCIN 40 UNITS IN NORMAL SALINE INFUSION - SIMPLE MED: 1.0000 m[IU]/min | INTRAVENOUS | Status: DC

## 2019-10-29 MED ORDER — ONDANSETRON HCL 4 MG/2ML IJ SOLN: 4.0000 mg | INTRAMUSCULAR | Status: DC | PRN

## 2019-10-29 MED ORDER — OXYTOCIN 40 UNITS IN NORMAL SALINE INFUSION - SIMPLE MED
1.0000 m[IU]/min | INTRAVENOUS | Status: DC
  Administered 2019-10-29: 2 m[IU]/min via INTRAVENOUS

## 2019-10-29 MED ORDER — IBUPROFEN 600 MG PO TABS
600.0000 mg | ORAL_TABLET | Freq: Four times a day (QID) | ORAL | Status: DC
  Administered 2019-10-29 – 2019-10-30 (×4): 600 mg via ORAL
  Filled 2019-10-29 (×4): qty 1

## 2019-10-29 MED ORDER — SIMETHICONE 80 MG PO CHEW: 80.0000 mg | CHEWABLE_TABLET | ORAL | Status: DC | PRN

## 2019-10-29 MED ORDER — PRENATAL MULTIVITAMIN CH
1.0000 | ORAL_TABLET | Freq: Every day | ORAL | Status: DC
  Administered 2019-10-30: 1 via ORAL
  Filled 2019-10-29: qty 1

## 2019-10-29 MED ORDER — TETANUS-DIPHTH-ACELL PERTUSSIS 5-2.5-18.5 LF-MCG/0.5 IM SUSP: 0.5000 mL | Freq: Once | INTRAMUSCULAR | Status: DC

## 2019-10-29 NOTE — Progress Notes (Signed)
Nurse called to patient's room to examine blood clot pt noticed in pad. Clot slightly smaller than golf-ball size. Fundus midline and firm; scant amount of bleeding with fundal rub. Instructed pt to continue notifying nurse of further clots or large amount of bleeding. Pt receptive. Will continue to monitor.

## 2019-10-29 NOTE — H&P (Addendum)
OB ADMISSION/ HISTORY & PHYSICAL:  Admission Date: 10/29/2019  8:14 AM  Admit Diagnosis: Postmenopausal bleeding [N95.0]    Jennifer Knox is a 29 y.o. female presenting for scheduled IOL, for late term.  Prenatal History: [id number]   EDC : 10/23/2019, by Other Basis  [facility] at [facility] since 11 wks.   Prenatal course complicated by: Fetal arrhythmia 28 wks, normal fetal echo Hx seizure disorder, stab;e off meds since 2008 Rh negative, Rhogam at 30 wks 08/14/2019 Hx SAB x 2, D&C Hx peritoneal cyst, lap 2015  Prenatal Labs: ABO, Rh:   O neg Antibody:  neg Rubella:   imm RPR:   neg HBsAg:   neg HIV:   neg GBS:   neg 1 hr Glucola : 97 Genetic Screening: declined Ultrasound: normal anatomy, undisclosed gender, anterior placenta, AGA Pelvis proven to 8'6, 3rd deg lac w G1 TdaP UTD, declined flu vax    Maternal Diabetes: No Genetic Screening: Declined Maternal Ultrasounds/Referrals: Normal Fetal Ultrasounds or other Referrals:  Fetal echo Maternal Substance Abuse:  No Significant Maternal Medications:  None Significant Maternal Lab Results:  Group B Strep negative Other Comments:  None  Medical / Surgical History :  Past medical history:  Past Medical History:  Diagnosis Date  . Anemia   . GERD (gastroesophageal reflux disease)   . Seizures ([place]) 2006   No seizures since 2008, triggers:lack if sleep, no meds since 2012  . SVD (spontaneous vaginal delivery) 08/2013   x 1 [place]     Past surgical history:  Past Surgical History:  Procedure Laterality Date  . DILATION AND EVACUATION N/A 02/17/2018   Procedure: DILATATION AND EVACUATION;  Surgeon: Sanjuana Kava, MD;  Location: [facility];  Service: Gynecology;  Laterality: N/A;  . LAPAROSCOPY N/A 07/01/2014   Procedure: LAPAROSCOPY OPERATIVE With paratubal cystectomy;  Surgeon: Eldred Manges, MD;  Location: [facility];  Service: Gynecology;  Laterality: N/A;  . WISDOM TOOTH EXTRACTION       Family  History: History reviewed. No pertinent family history.   Social History:  reports that she has never smoked. She has never used smokeless tobacco. She reports that she does not drink alcohol or use drugs.   Allergies: Penicillins   Current Medications at time of admission:  Medications Prior to Admission  Medication Sig Dispense Refill Last Dose  . cyclobenzaprine (FLEXERIL) 10 MG tablet Take 1 tablet (10 mg total) by mouth 3 (three) times daily as needed for muscle spasms. 12 tablet 0   . Prenatal Vit-Fe Fumarate-FA (PRENATAL MULTIVITAMIN) TABS tablet Take 1 tablet by mouth daily at 12 noon.        Review of Systems: ROS  Physical Exam: Vital signs and nursing notes reviewed.  ED Triage Vitals  Enc Vitals Group     BP 10/29/19 0847 109/67     Pulse Rate 10/29/19 0847 (!) 118     Resp 10/29/19 0847 16     Temp 10/29/19 0847 98.6 F (37 C)     Temp Source 10/29/19 0847 Oral     SpO2 --      Weight 10/29/19 0700 145 lb (65.8 kg)     Height 10/29/19 0700 5\' 7"  (1.702 m)     Head Circumference --      Peak Flow --      Pain Score --      Pain Loc --      Pain Edu? --      Excl. in [place]? --  General: AAO x 3, NAD, coping well Heart: RRR Lungs:CTAB Abdomen: Gravid, NT, Leopold's vertex Extremities: no edema Genitalia / VE: Dilation: 3 Effacement (%): 60 Cervical Position: Posterior Station: -1 Presentation: Vertex Exam by:: D. Eddie Dibbles, CNM   FHR:  150 BPM, mod variability, + accels, no decels TOCO: Ctx q 4-5 mild  Labs:   Pending T&S, CBC, RPR, CMP   Assessment:  29 y.o. [id number] at [redacted]w[redacted]d for IOL Nocturnal pruritus of feet x 1 week  1. latent stage of labor 2. FHR category 1 3. GBS neg 4. Desires unmedicated birth, open to nitrous, epidural PRN 5. Breastfeeding, planned, experienced, last BRF x 3 years 6. Placenta disposal per patient request  Plan:  1. Admit to BS 2. Routine L&D orders, add CMP, low suspicion for ICP 3. Analgesia/anesthesia PRN,  nitrous PRN 4. Pitocin augmentation 5. Anticipate NSVB   Dr Alesia Richards notified of admission / plan of care   Rosalie, MSN 10/29/2019, 9:14 AM

## 2019-10-29 NOTE — Lactation Note (Signed)
This note was copied from a baby's chart. Lactation Consultation Note  Patient Name: Jennifer Knox [id number] Date: 10/29/2019  mom is G3P3.  Baby Jennifer Baumgarner now 81 hours old.  Mom is an experienced breastfeeding mom.  Reports breastfed both of her other children 2 1/2 years with no challanges.  Mom denies need for breastfeeding services at this time.  Reviewed and left [facility] services and Resource handout.  Urged mom to call lactation as needed.   Maternal Data    Feeding    LATCH Score                   Interventions    Lactation Tools Discussed/Used     Consult Status      Rollen Sox 10/29/2019, 8:46 PM

## 2019-10-29 NOTE — Progress Notes (Signed)
Patient ID: Jennifer Knox, female   DOB: [birth year], 29 y.o.   MRN: [id number] Jennifer Knox is a 29 y.o. [id number] at [redacted]w[redacted]d by LMP admitted for post dates IOL  Subjective: Reports painful ctx since changing to side lying positions, requesting cervical exam  Objective: Vitals:   10/29/19 1022 10/29/19 1113 10/29/19 1154 10/29/19 1315  BP: 113/75 104/68 112/63 (!) 136/96  Pulse: (!) 110 84 90 85  Resp:  16    Temp:      TempSrc:      Weight:      Height:        No intake/output data recorded. No intake/output data recorded.   FHT:  FHR: 135 bpm, variability: moderate,  accelerations:  Present,  decelerations:  Absent UC:   regular, every 2-3 minutes SVE:   Dilation: 7 Effacement (%): 100 Station: -1 Exam by:: Elyse Prevo, cnm IBOW, vertex  Pitocin 14 MU/MIN  Labs:   Recent Labs    10/29/19 0849  WBC 7.7  HGB 11.7*  HCT 34.8*  PLT 194    Assessment / Plan: Induction of labor due to postdates,  progressing well on pitocin  Labor: DC Pitocin, allow for physiologic labor, desires unmedicated birth Preeclampsia:  no signs or symptoms of toxicity and labs stable Fetal Wellbeing:  Category I Pain Control:  Labor support without medications and partner Matt present and supportive I/D:  GBS neg Anticipated MOD:  NSVD, prep room for delivery  Juliene Pina, CNM, MSN 10/29/2019, 2:59 PM

## 2019-10-30 LAB — CBC
HCT: 31.2 % — ABNORMAL LOW (ref 36.0–46.0)
Hemoglobin: 10.6 g/dL — ABNORMAL LOW (ref 12.0–15.0)
MCH: 32 pg (ref 26.0–34.0)
MCHC: 34 g/dL (ref 30.0–36.0)
MCV: 94.3 fL (ref 80.0–100.0)
Platelets: 166 10*3/uL (ref 150–400)
RBC: 3.31 MIL/uL — ABNORMAL LOW (ref 3.87–5.11)
RDW: 14.3 % (ref 11.5–15.5)
WBC: 9 10*3/uL (ref 4.0–10.5)
nRBC: 0 % (ref 0.0–0.2)

## 2019-10-30 MED ORDER — ACETAMINOPHEN 325 MG PO TABS: 650.0000 mg | ORAL_TABLET | ORAL | 0 refills | Status: DC | PRN

## 2019-10-30 MED ORDER — IBUPROFEN 600 MG PO TABS: 600.0000 mg | ORAL_TABLET | Freq: Four times a day (QID) | ORAL | 0 refills | Status: DC

## 2019-10-30 NOTE — Discharge Summary (Signed)
NSVD OB Discharge Summary  Patient Name: Jennifer Knox DOB: [birth year] MRN: [id number]  Date of admission: 10/29/2019 Delivering MD: Jennifer Knox  Date of delivery: 10/29/2019 Type of delivery: NSVD  Newborn Data: Sex: Baby girl  Name: Jennifer Knox  Live born female  Birth Weight: 8 lb 7 oz (3827 g) APGAR: 20, 47  Newborn Delivery   Birth date/time: 10/29/2019 15:55:00 Delivery type: Vaginal, Spontaneous    Feeding: breast Infant being discharge to home with mother in stable condition.   Admitting diagnosis: Postmenopausal bleeding [N95.0] Intrauterine pregnancy: [redacted]w[redacted]d     Secondary diagnosis:  Principal Problem:   Postpartum care following vaginal delivery 4/13 Active Problems:   Rh negative, maternal   SVD (spontaneous vaginal delivery)   Encounter for planned induction of labor                               Complications: None                                                              Intrapartum Procedures: spontaneous vaginal delivery Postpartum Procedures: none Complications-Operative and Postpartum: none Augmentation: Pitocin  History of Present Illness: Ms. Jennifer Knox is a 29 y.o. female, [id number], who presents at [redacted]w[redacted]d weeks gestation. The patient has been followed at  [facility]  Her pregnancy has been complicated by:  Patient Active Problem List   Diagnosis Date Noted  . Encounter for planned induction of labor 10/29/2019  . Postpartum care following vaginal delivery 4/13 10/29/2019  . SVD (spontaneous vaginal delivery) 11/21/2015  . Penicillin allergy (hives) 11/05/2015  . History of seizure disorder (no seizures since 2008; no meds since 2012) 11/05/2015  . Rh negative, maternal 11/05/2015  . GERD (gastroesophageal reflux disease) 11/05/2015   Hospital course:  Onset of Labor With Vaginal Delivery     29 y.o. yo [id number] at [redacted]w[redacted]d was admitted for IOL for post dates on 10/29/2019. Patient had an uncomplicated labor course as  follows:  Membrane Rupture Time/Date: 3:44 PM ,10/29/2019   Intrapartum Procedures: Episiotomy: None [1]                                         Lacerations:  None [1]  Patient had a delivery of a Viable infant. 10/29/2019  Information for the patient's newborn:  Jennifer Knox, [place] Girl Baylie [id number]  Delivery Method: Vag-Spont   Patient had an uncomplicated postpartum course.  She is ambulating, tolerating a regular diet, passing flatus, and urinating well. Patient is discharged home in stable condition on 10/30/19.  Postpartum Day # 1 : S/P NSVD due to IOL for post dates. Patient up ad lib, denies syncope or dizziness. Reports consuming regular diet without issues and denies N/V. Patient reports she had a bowel movement today and is passing flatus.  Denies issues with urination and reports bleeding is "slowing down."  Patient is breastfeeding and reports going well.  Desires natural family planning for postpartum contraception.  Pain is being appropriately managed with use of Motrin.   Physical exam  Vitals:   10/29/19 1852 10/29/19 2355 10/30/19 0520 10/30/19 0900  BP: 120/71 91/63  101/63 (!) 102/56  Pulse: 93 84 70 70  Resp:  16 16 16   Temp: 98.6 F (37 C) 98.2 F (36.8 C) 98.4 F (36.9 C) 98.5 F (36.9 C)  TempSrc: Oral Oral Oral Oral  SpO2: 99% 98% 99% 100%  Weight:      Height:       General: alert, cooperative and no distress Lochia: appropriate Uterine Fundus: firm Perineum: intact DVT Evaluation: No evidence of DVT seen on physical exam.  Labs: Lab Results  Component Value Date   WBC 9.0 10/30/2019   HGB 10.6 (L) 10/30/2019   HCT 31.2 (L) 10/30/2019   MCV 94.3 10/30/2019   PLT 166 10/30/2019   CMP Latest Ref Rng & Units 10/29/2019  Glucose 70 - 99 mg/dL 140(H)  BUN 6 - 20 mg/dL 8  Creatinine 0.44 - 1.00 mg/dL 0.69  Sodium 135 - 145 mmol/L 136  Potassium 3.5 - 5.1 mmol/L 3.4(L)  Chloride 98 - 111 mmol/L 104  CO2 22 - 32 mmol/L 22  Calcium 8.9 - 10.3 mg/dL 8.9   Total Protein 6.5 - 8.1 g/dL 6.2(L)  Total Bilirubin 0.3 - 1.2 mg/dL 0.8  Alkaline Phos 38 - 126 U/L 152(H)  AST 15 - 41 U/L 27  ALT 0 - 44 U/L 15   Date of discharge: 10/30/2019 Discharge Diagnoses: Term Pregnancy-delivered Discharge instruction: per After Visit Summary and "Baby and Me Booklet".  Activity:           unrestricted Advance as tolerated. Pelvic rest for 6 weeks.  Diet:                routine Medications: Ibuprofen Postpartum contraception: Natural Family Planning Condition:  Pt discharge to home with baby in stable condition  Meds: Allergies as of 10/30/2019      Reactions   Penicillins Hives   Has patient had a PCN reaction causing immediate rash, facial/tongue/throat swelling, SOB or lightheadedness with hypotension: unknown Has patient had a PCN reaction causing severe rash involving mucus membranes or skin necrosis: unknown Has patient had a PCN reaction that required hospitalization no Has patient had a PCN reaction occurring within the last 10 years: no If all of the above answers are "NO", then may proceed with Cephalosporin use.      Medication List    TAKE these medications   acetaminophen 325 MG tablet Commonly known as: Tylenol Take 2 tablets (650 mg total) by mouth every 4 (four) hours as needed (for pain scale < 4).   ibuprofen 600 MG tablet Commonly known as: ADVIL Take 1 tablet (600 mg total) by mouth every 6 (six) hours.   prenatal multivitamin Tabs tablet Take 1 tablet by mouth daily at 12 noon.      Discharge Follow Up:  Follow-up [facility]. Schedule an appointment as soon as possible for a visit in 6 week(s).   Specialty: Obstetrics and Gynecology Contact information: [address] [place] [phone]          Effie Shy, MSN 10/30/2019, 12:57 PM

## 2019-11-04 LAB — SURGICAL PATHOLOGY

## 2019-11-25 IMAGING — DX DG CHEST 2V
2 series · 2 of 2 positions shown · non-contrast
Comparison: None.

CLINICAL DATA: Central chest pressure radiating to the left side of
the neck and back since today.

EXAM:
CHEST - 2 VIEW

[chest pa]
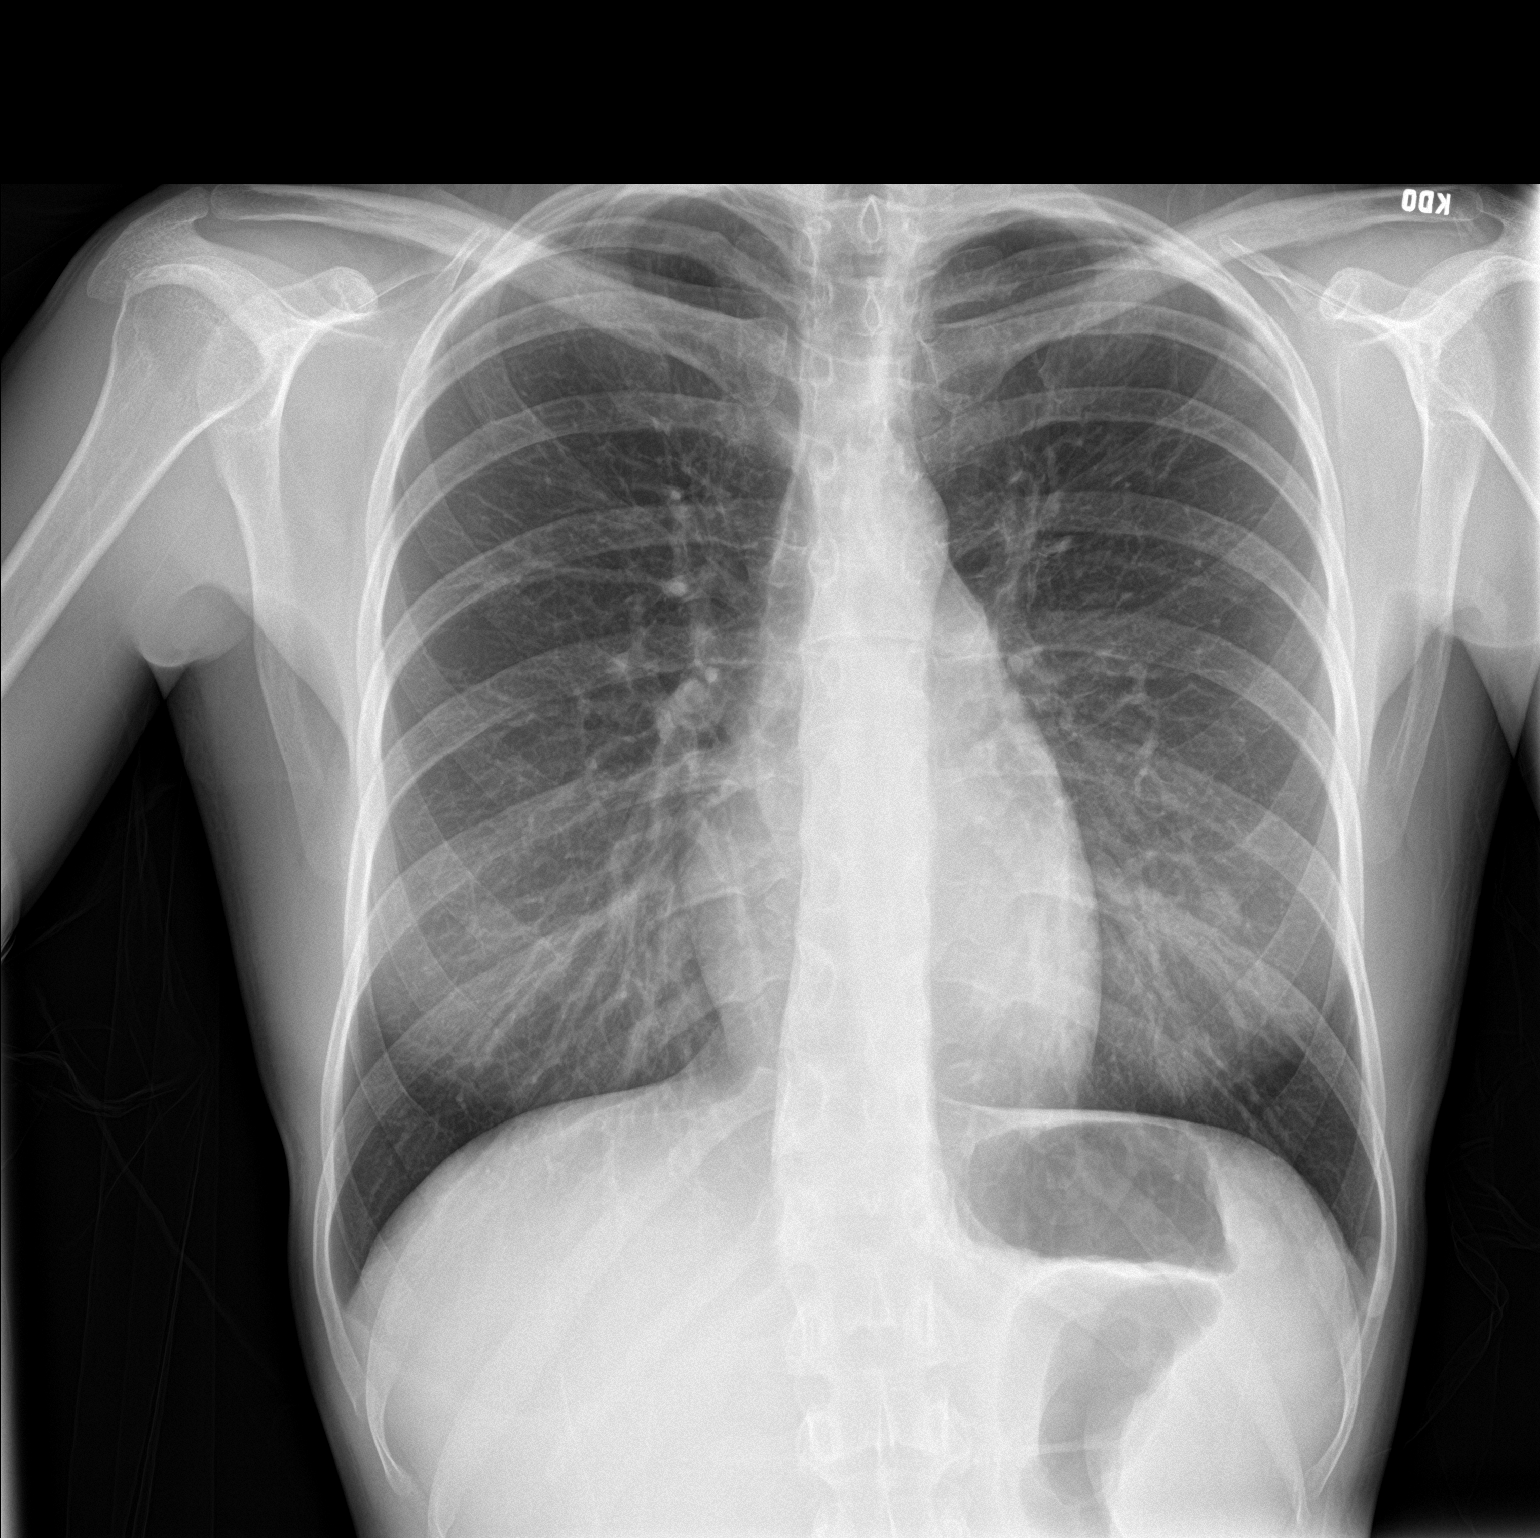

[chest lat]
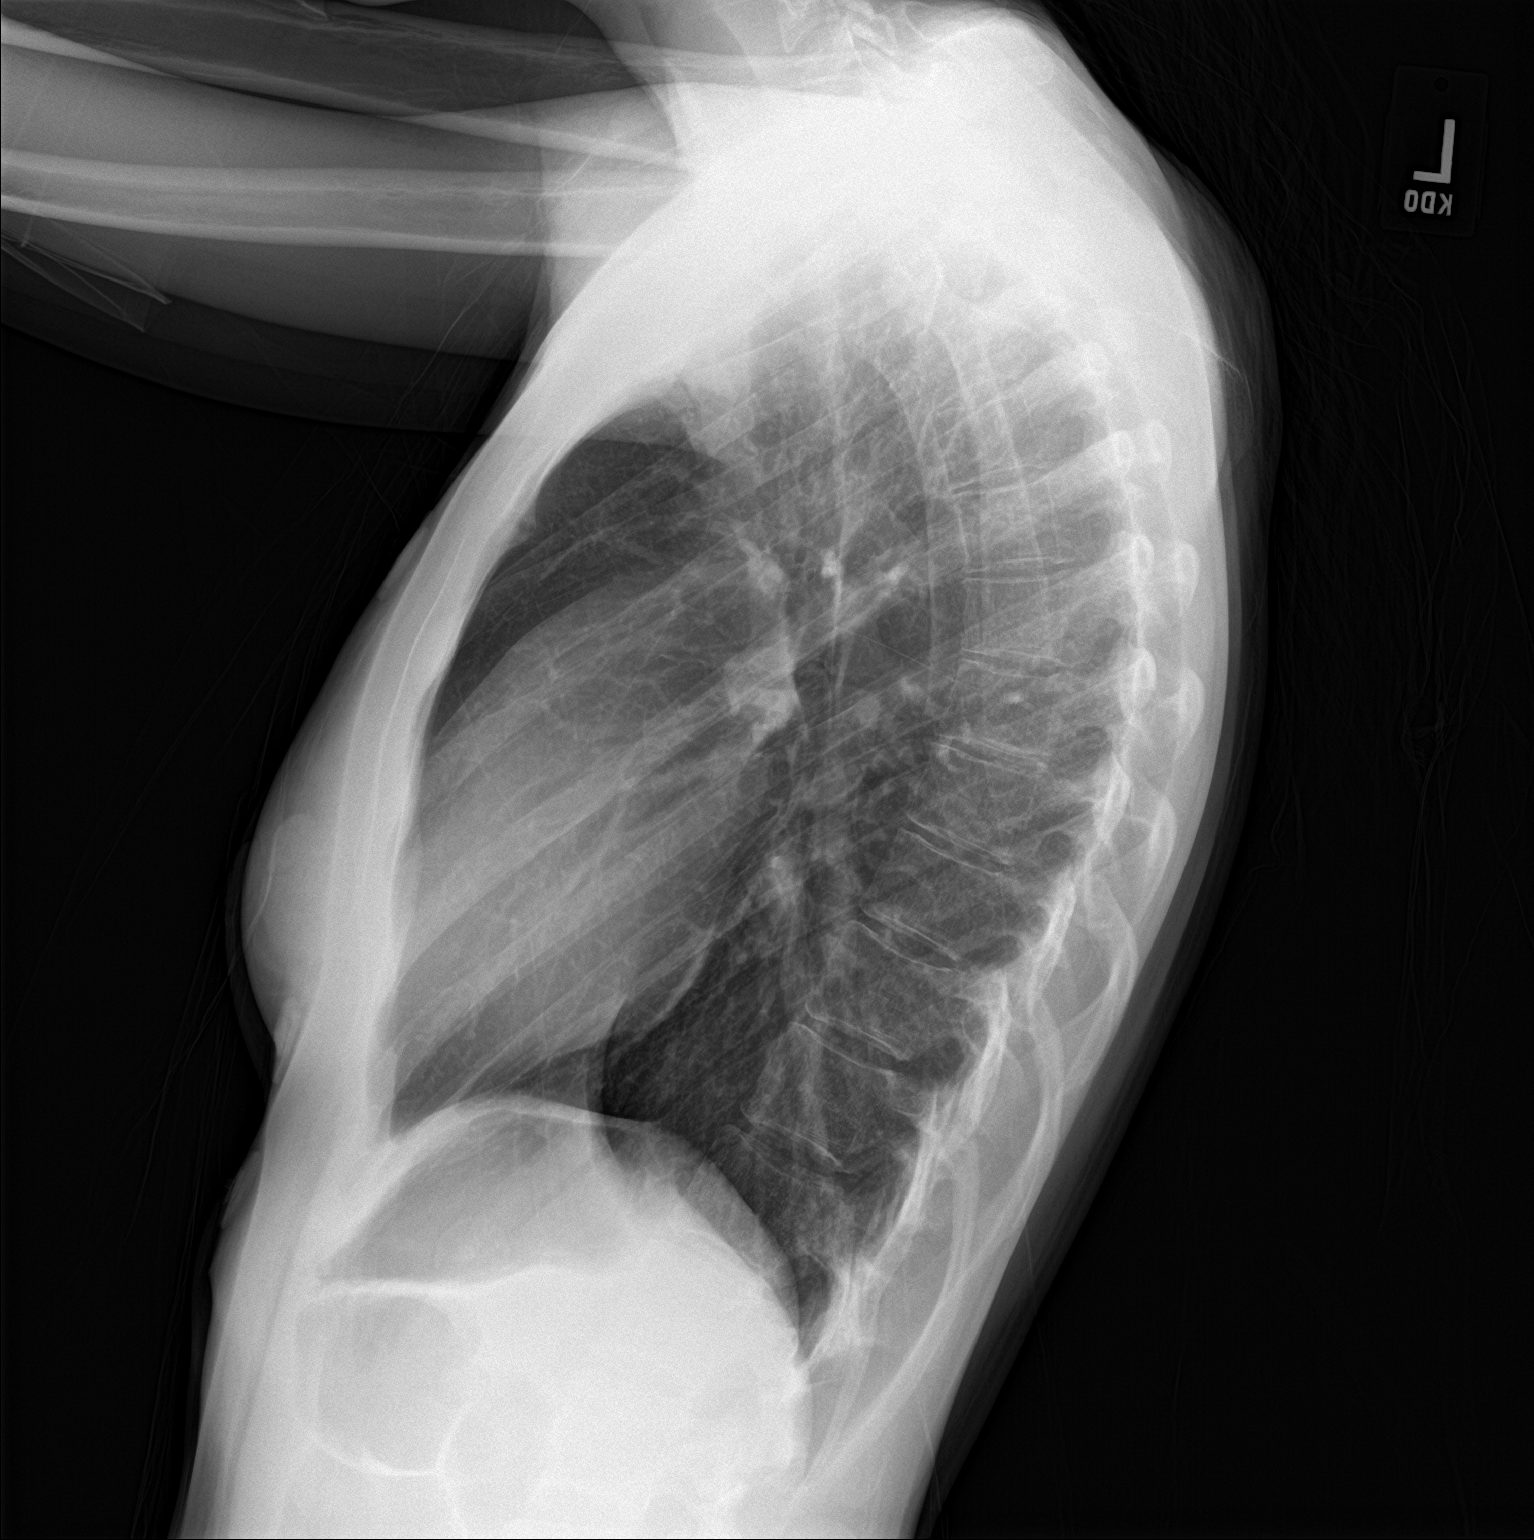

[2 of 2 positions shown; findings below may reference images not displayed]

FINDINGS: The heart size and mediastinal contours are within normal limits.
Both lungs are clear. The visualized skeletal structures are
unremarkable.
IMPRESSION: No active cardiopulmonary disease.

## 2020-01-29 IMAGING — US US OB TRANSVAGINAL
1 series · 15 of 22 positions shown · non-contrast
Comparison: Pelvic ultrasound 04/28/2018

CLINICAL DATA: Pregnant patient with pelvic pain.

EXAM:
OBSTETRIC <14 WK US AND TRANSVAGINAL OB US
TECHNIQUE: Both transabdominal and transvaginal ultrasound examinations were
performed for complete evaluation of the gestation as well as the
maternal uterus, adnexal regions, and pelvic cul-de-sac.
Transvaginal technique was performed to assess early pregnancy.

[Series 1: us ob transvaginal · 15 of 22 slices shown]
[im 1/22]
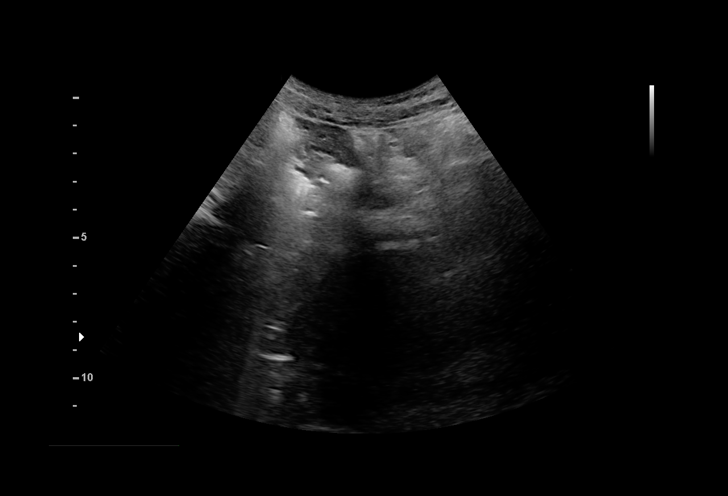
[im 3/22]
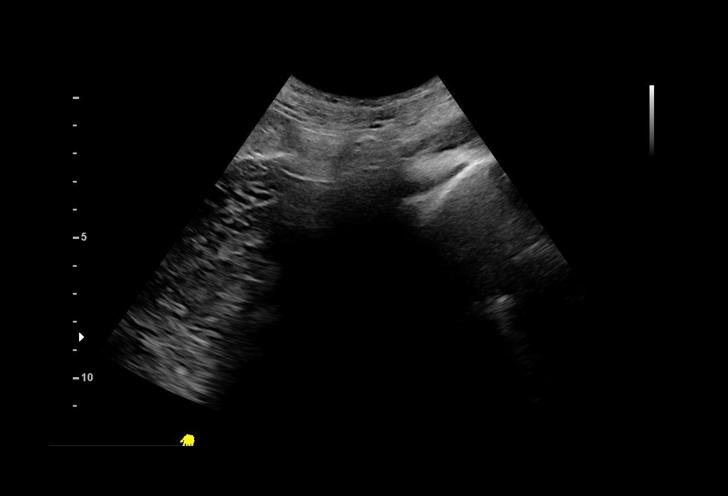
[im 4/22]
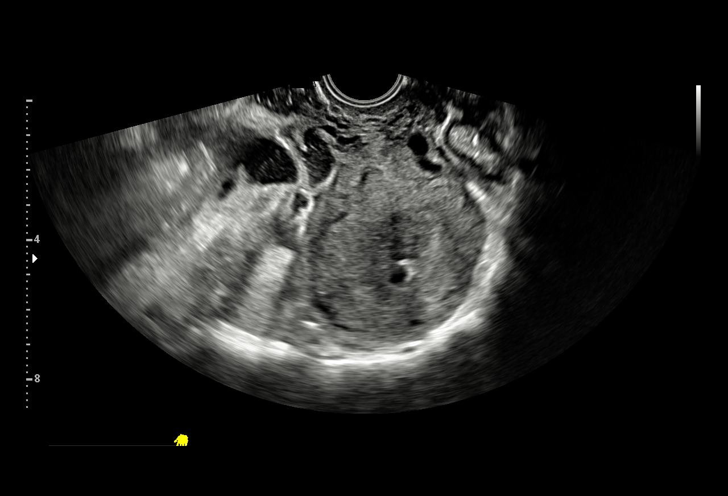
[im 6/22]
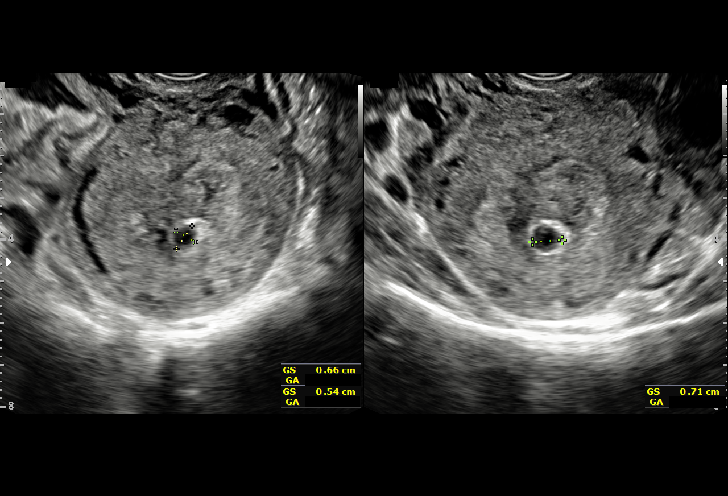
[im 7/22]
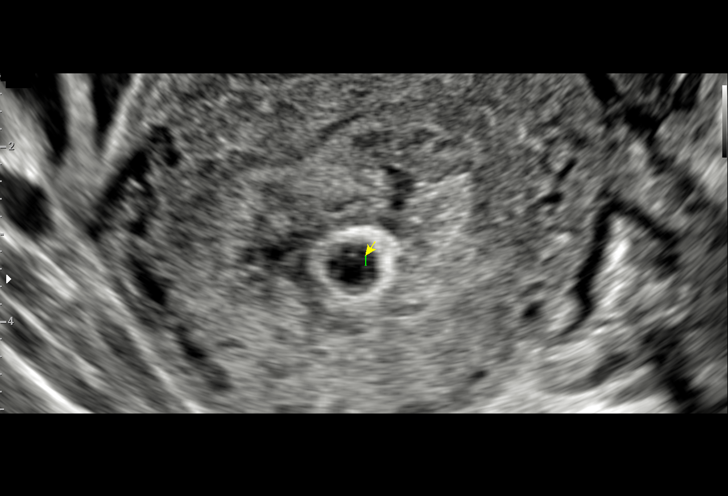
[im 9/22]
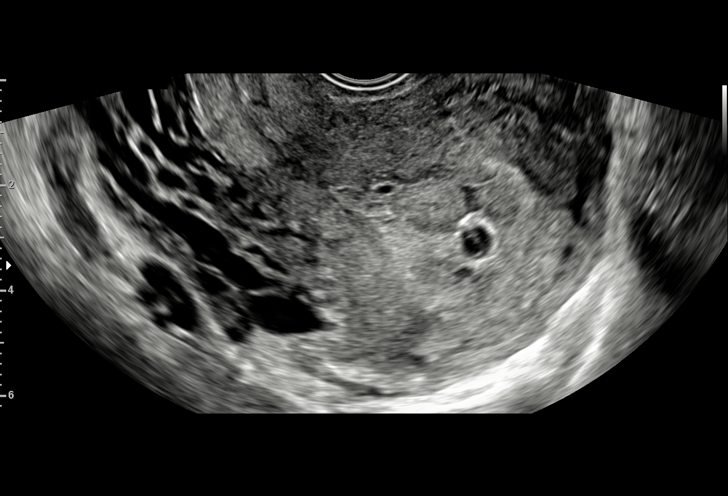
[im 10/22]
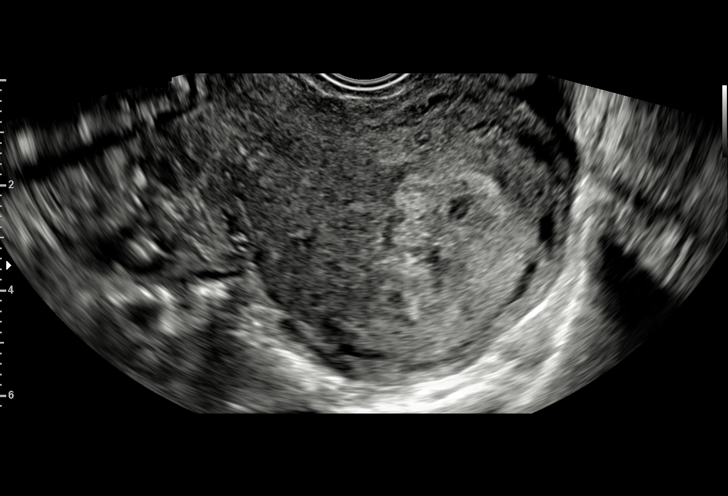
[im 12/22]
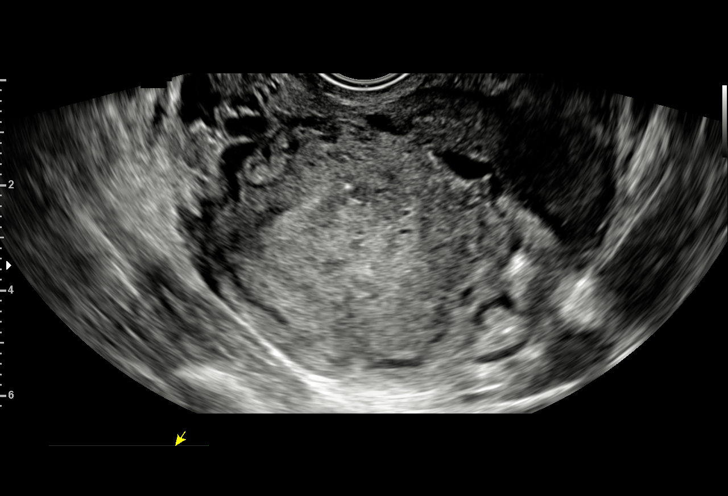
[im 13/22]
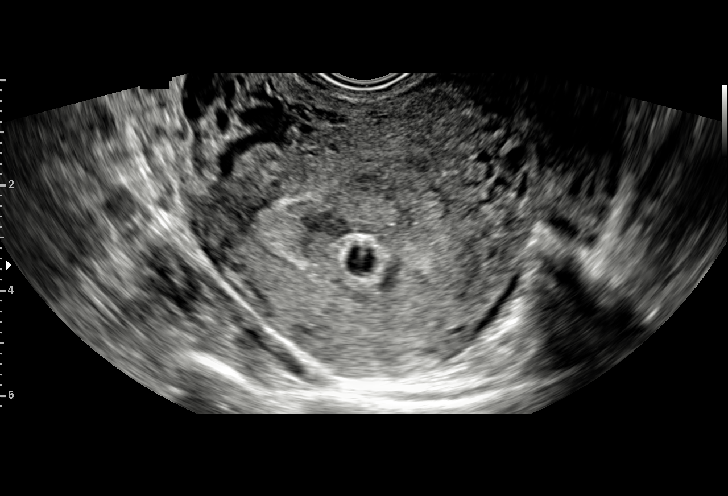
[im 14/22]
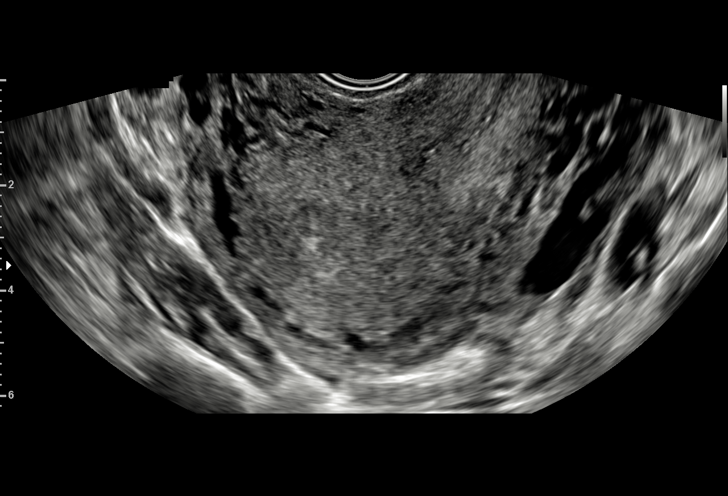
[im 16/22]
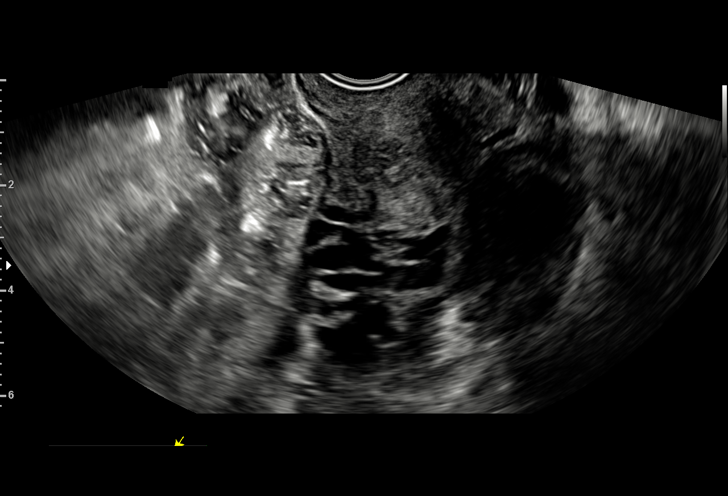
[im 17/22]
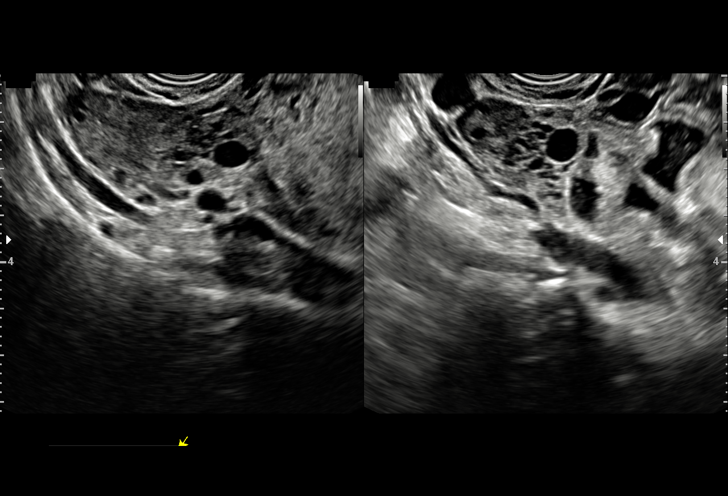
[im 19/22]
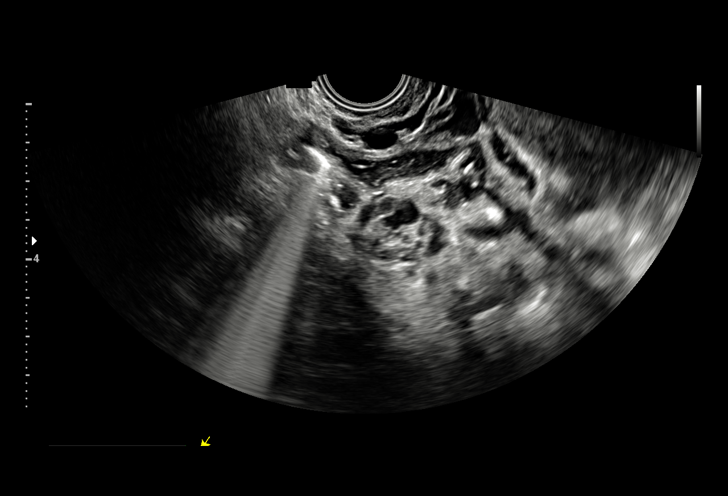
[im 20/22]
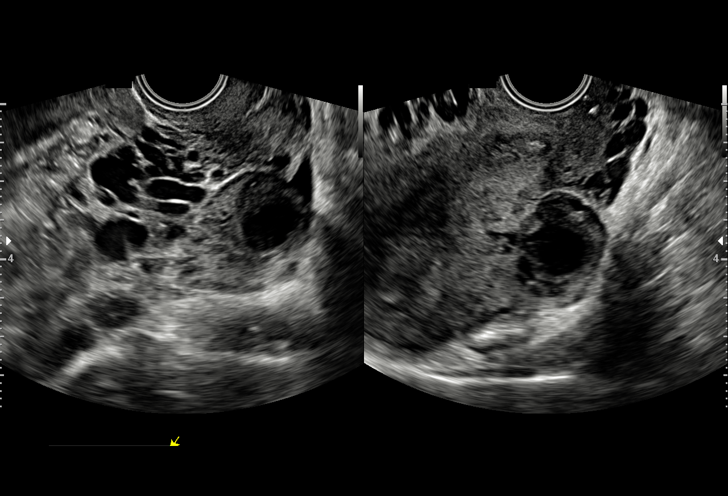
[im 22/22]
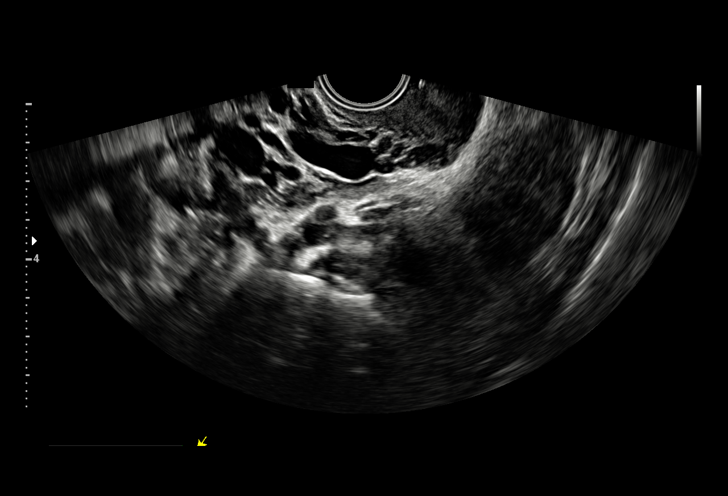

[15 of 22 positions shown; findings below may reference images not displayed]

FINDINGS: Intrauterine gestational sac: Single

Yolk sac:  Visualized.

Embryo:  Not Visualized.

Cardiac Activity: Not Visualized.

MSD: 6.3 mm   5 w   2 d

Subchorionic hemorrhage:  Small

Maternal uterus/adnexae: Normal right and left ovaries. Left ovarian
corpus luteum. No free fluid in the pelvis.
IMPRESSION: Probable early intrauterine gestational sac and yolk sac but no
fetal pole or cardiac activity yet visualized. Recommend follow-up
quantitative B-HCG levels and follow-up US in 14 days to assess
viability. This recommendation follows SRU consensus guidelines:
Diagnostic Criteria for Nonviable Pregnancy Early in the First
Trimester. N Engl J Med 6102; [DATE].

Small subchorionic hemorrhage.

## 2020-02-02 IMAGING — US US OB TRANSVAGINAL
1 series · 15 of 28 positions shown · non-contrast
Comparison: 04/28/2018

CLINICAL DATA: Intermittent bleeding

EXAM:
TRANSVAGINAL OB ULTRASOUND
TECHNIQUE: Transvaginal ultrasound was performed for complete evaluation of the
gestation as well as the maternal uterus, adnexal regions, and
pelvic cul-de-sac.

[Series 1: us ob transvaginal · 32 acquisitions, 15 frames shown]
[im 1/32]
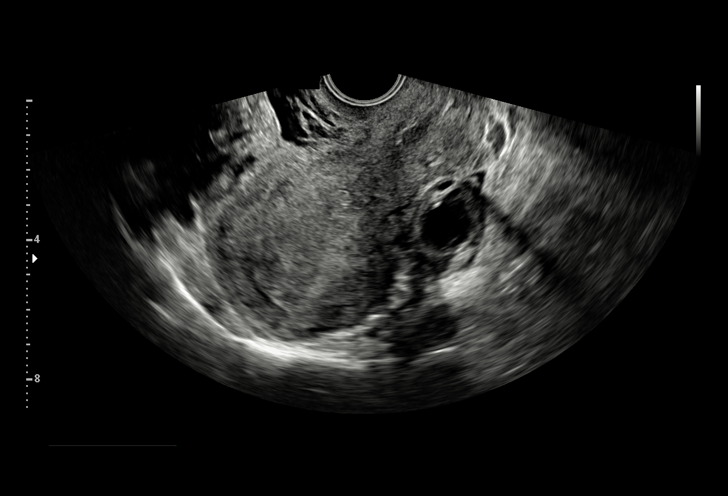
[im 3/32]
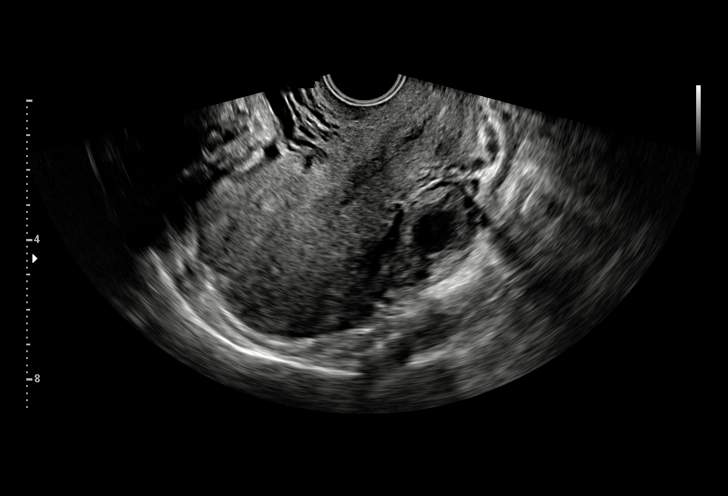
[im 5/32]
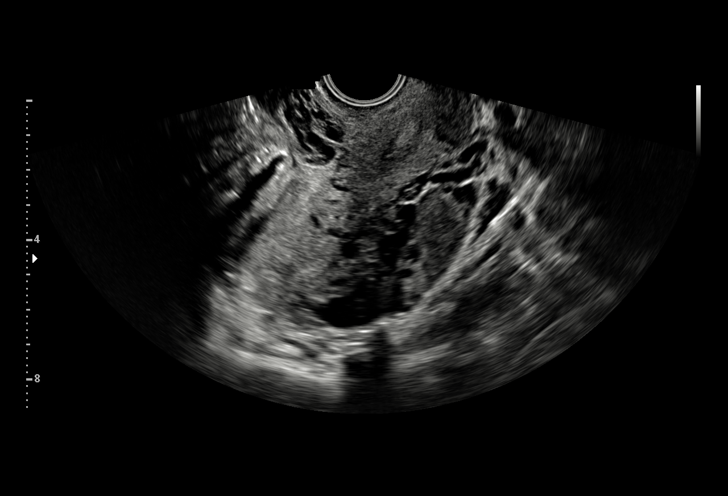
[im 7/32]
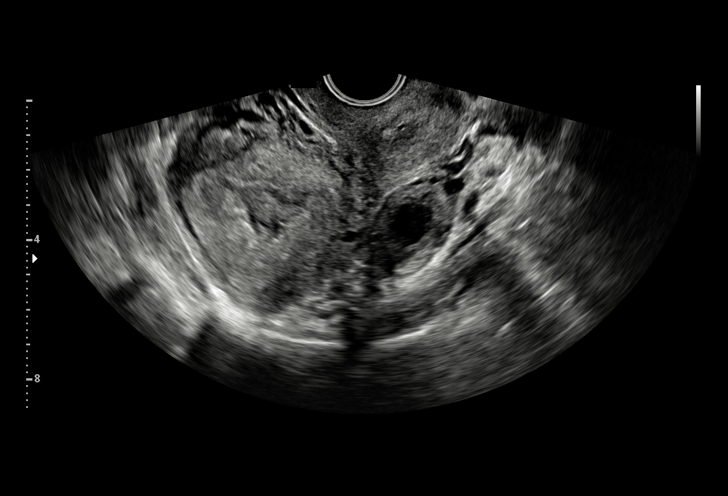
[im 10/32]
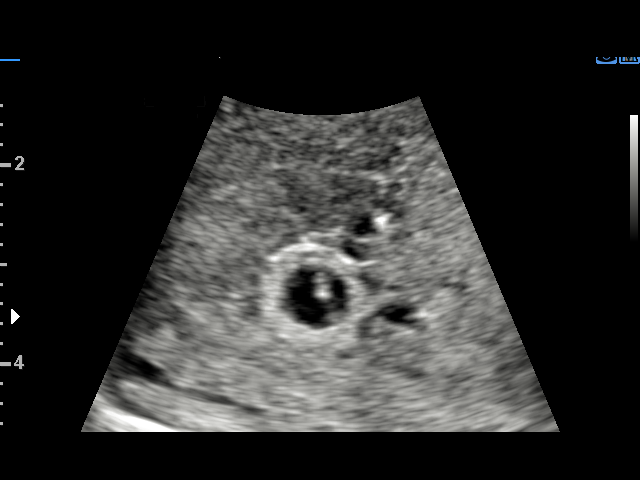
[im 12/32]
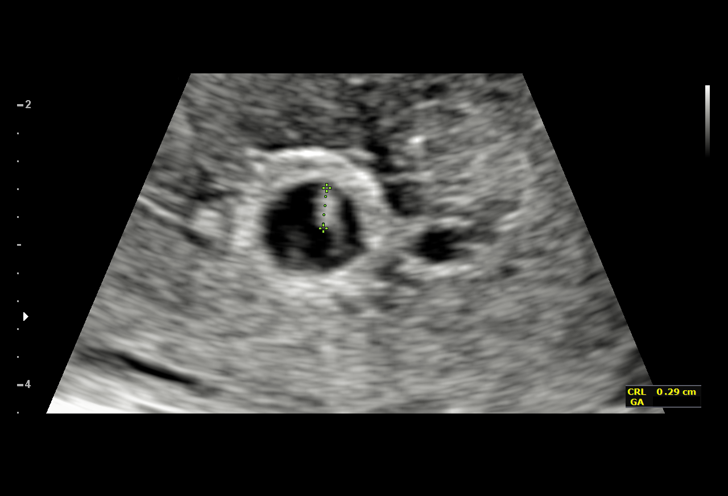
[im 14/32]
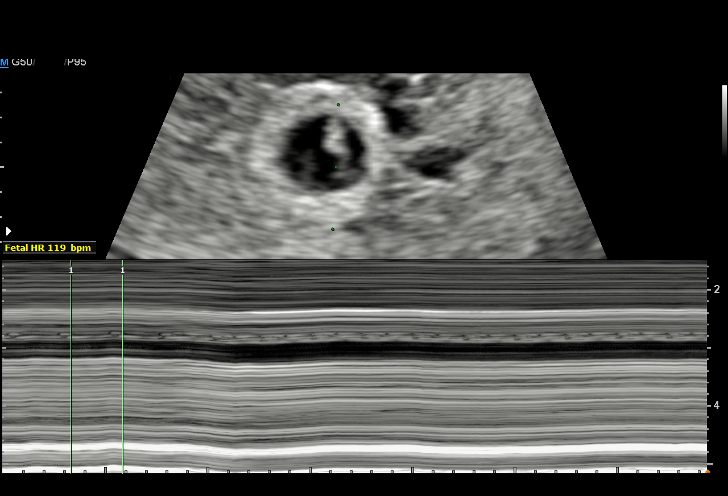
[im 17/32]
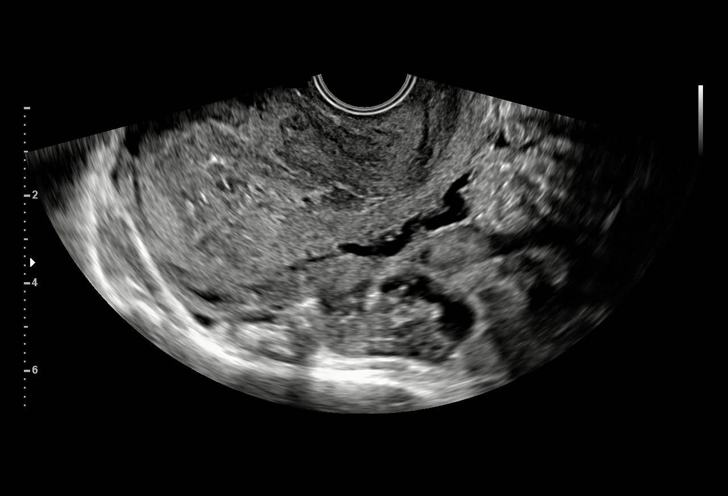
[im 18/32]
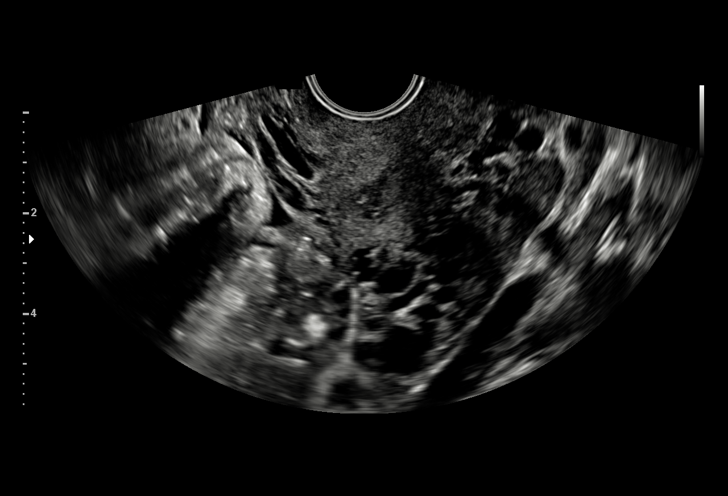
[im 20/32]
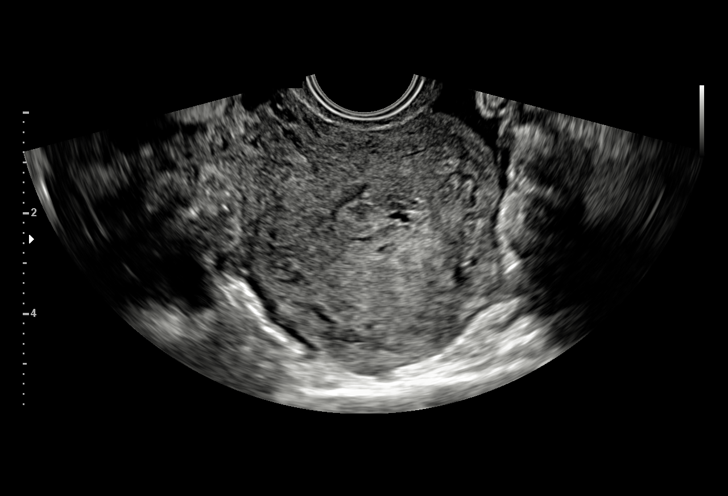
[im 22/32]
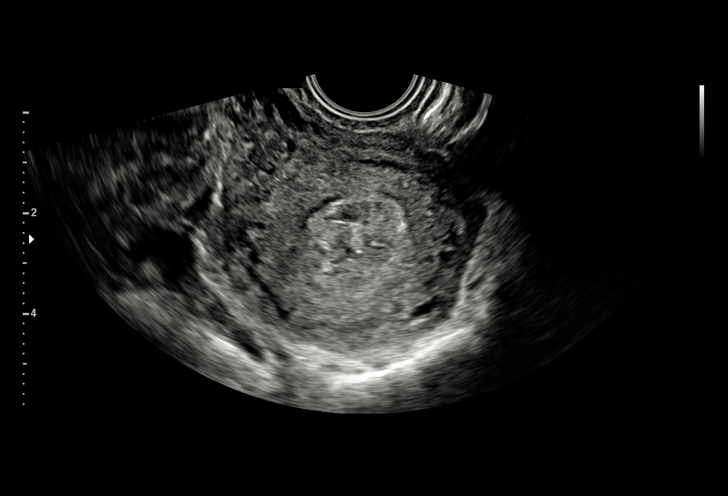
[im 25/32]
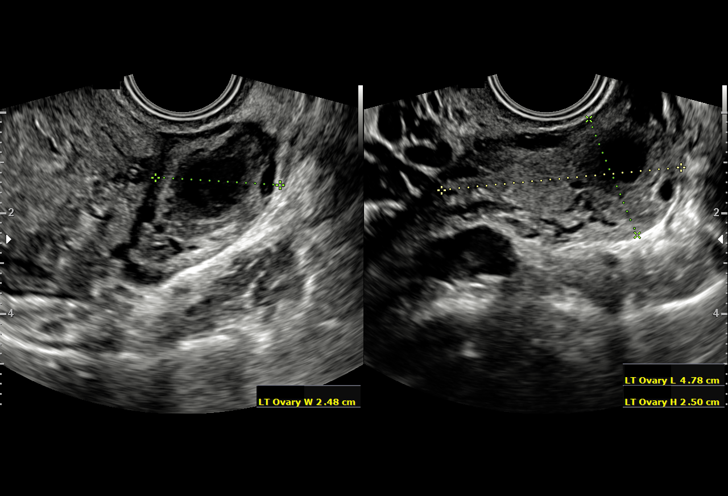
[im 27/32]
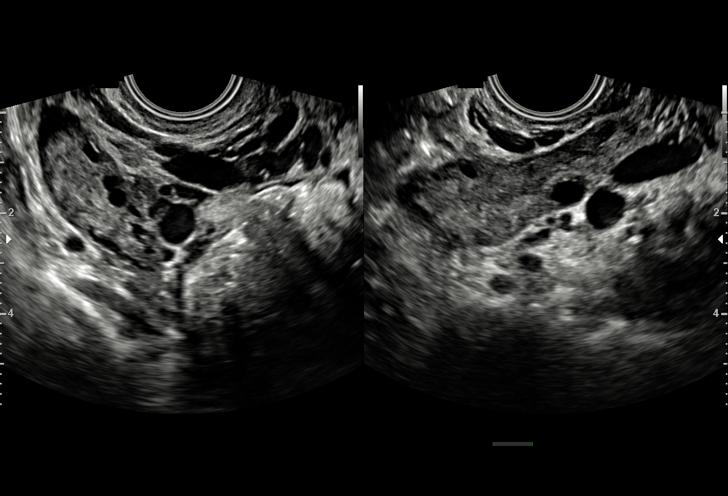
[im 29/32]
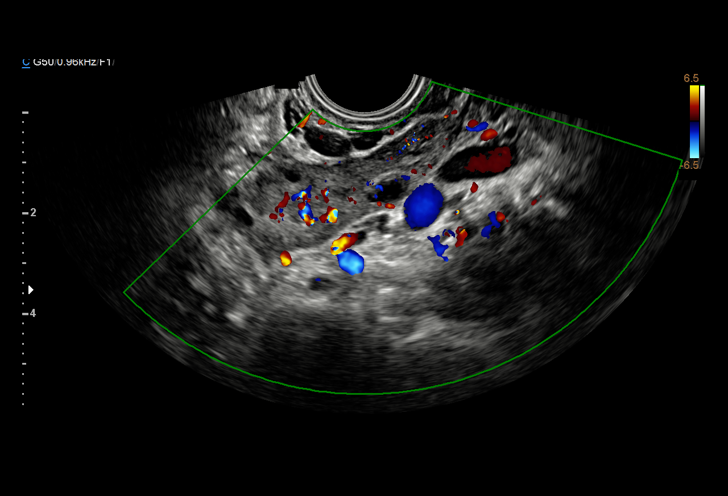
[im 32/32]
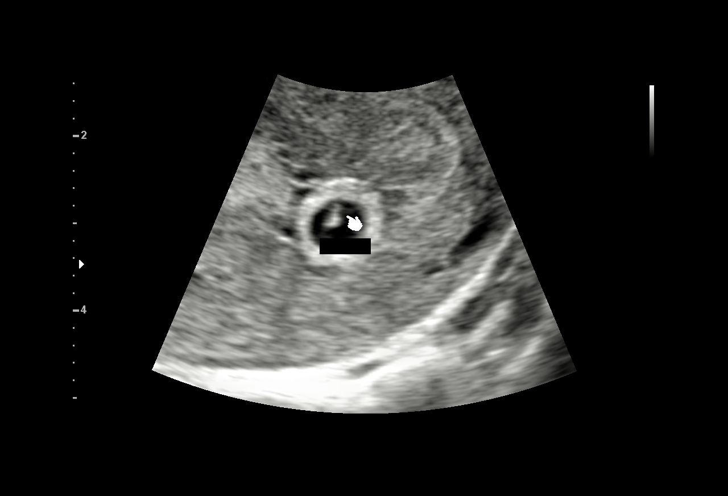

[15 of 28 positions shown; findings below may reference images not displayed]

FINDINGS: Intrauterine gestational sac: Single intrauterine pregnancy

Yolk sac:  Visible

Embryo:  Visible

Cardiac Activity: Visible

Heart Rate: 119 bpm

CRL: 2.8 mm   5 w 5 d                  US EDC: 12/28/2018

Subchorionic hemorrhage:  Moderate subchorionic hemorrhage

Maternal uterus/adnexae: Ovaries are within normal limits. Left
ovary measures 2.5 x 4.8 x 2.5 cm and contains a corpus luteum.
Right ovary measures 4.7 x 1.9 x 2.3 cm. No significant free fluid.
IMPRESSION: 1. Single viable intrauterine pregnancy as above
2. Moderate subchorionic hemorrhage

## 2020-03-11 ENCOUNTER — Encounter: Payer: Self-pay | Admitting: Physician Assistant

## 2020-03-11 ENCOUNTER — Ambulatory Visit (INDEPENDENT_AMBULATORY_CARE_PROVIDER_SITE_OTHER): Payer: Self-pay | Admitting: Physician Assistant

## 2020-03-11 ENCOUNTER — Other Ambulatory Visit: Payer: Self-pay

## 2020-03-11 DIAGNOSIS — D485 Neoplasm of uncertain behavior of skin: Secondary | ICD-10-CM

## 2020-03-11 DIAGNOSIS — Z1283 Encounter for screening for malignant neoplasm of skin: Secondary | ICD-10-CM

## 2020-03-11 DIAGNOSIS — Z86018 Personal history of other benign neoplasm: Secondary | ICD-10-CM

## 2020-03-11 DIAGNOSIS — Z87898 Personal history of other specified conditions: Secondary | ICD-10-CM

## 2020-03-11 NOTE — Patient Instructions (Signed)

## 2020-03-11 NOTE — Progress Notes (Signed)
   New Patient Visit  Subjective  Jennifer Knox is a 29 y.o. female who presents for the following: Annual Exam (Had mole removed when her daughter was born came back mild atypia. She just wants checked and to see if anything else needs to be removed. ). The mole was in her pubic region and was a large mole and itched with pregnancy. It came back with mild atypia and so they felt she should have a full skin check. Mom has had atypical moles.   Objective  Well appearing patient in no apparent distress; mood and affect are within normal limits.  A full examination was performed including head, eyes, ears, nose, lips, neck, chest, axillae, abdomen, back, buttocks, bilateral upper extremities, bilateral lower extremities, hands, feet, fingers, toes, fingernails, and toenails. All findings within normal limits unless otherwise noted below.  Objective  Left Suprapubic Area: Mild atypia. Some recurrent pigment noted along peripheral margin.  Objective  Mid Back: Full body skin exam.   Objective  Left Lower Back: Brown macule with irregular color  Objective  Left Abdomen (side) - Lower: Brown inflamed papule     Assessment & Plan  History of atypical nevus Left Suprapubic Area  Monitor for changes.  Screening for malignant neoplasm of skin Mid Back  Neoplasm of uncertain behavior of skin (2) Left Lower Back  Skin / nail biopsy Type of biopsy: tangential   Informed consent: discussed and consent obtained   Timeout: patient name, date of birth, surgical site, and procedure verified   Procedure prep:  Patient was prepped and draped in usual sterile fashion (Non sterile) Prep type:  Chlorhexidine Anesthesia: the lesion was anesthetized in a standard fashion   Anesthetic:  1% lidocaine w/ epinephrine 1-100,000 local infiltration Instrument used: flexible razor blade   Outcome: patient tolerated procedure well   Post-procedure details: wound care instructions given    Specimen  1 - Surgical pathology Differential Diagnosis: Atypia Check Margins: No  Left Abdomen (side) - Lower  Skin / nail biopsy Type of biopsy: tangential   Informed consent: discussed and consent obtained   Timeout: patient name, date of birth, surgical site, and procedure verified   Procedure prep:  Patient was prepped and draped in usual sterile fashion (Non sterile) Prep type:  Chlorhexidine Anesthesia: the lesion was anesthetized in a standard fashion   Anesthetic:  1% lidocaine w/ epinephrine 1-100,000 local infiltration Instrument used: flexible razor blade   Outcome: patient tolerated procedure well   Post-procedure details: wound care instructions given    Specimen 2 - Surgical pathology Differential Diagnosis: CN vs atypia Check Margins: No

## 2020-03-16 ENCOUNTER — Telehealth: Payer: Self-pay | Admitting: *Deleted

## 2020-03-16 NOTE — Telephone Encounter (Signed)
Pathology results to patient, 3 month follow up scheduled.

## 2020-03-16 NOTE — Telephone Encounter (Signed)
-----   Message from Arlyss Gandy, PA-C sent at 03/16/2020  8:34 AM EDT ----- Moderate. Recheck 3-6 months.

## 2020-05-02 ENCOUNTER — Other Ambulatory Visit (HOSPITAL_COMMUNITY): Payer: Self-pay

## 2020-05-02 ENCOUNTER — Telehealth (HOSPITAL_COMMUNITY): Payer: Self-pay | Admitting: Emergency Medicine

## 2020-05-02 NOTE — Telephone Encounter (Signed)
Called pt and explained possible monoclonal antibody treatment. Sx started 10/13. Tested positive 10/15 at [facility] on [address]. Sx include headaches, body aches, cough, chest pressure, and fatigue. Qualifying risk factors epilepsy. Pt interested in tx. Informed pt an APP will call back to possibly schedule an appointment.

## 2020-05-03 ENCOUNTER — Other Ambulatory Visit (HOSPITAL_COMMUNITY): Payer: Self-pay | Admitting: Oncology

## 2020-05-03 ENCOUNTER — Telehealth (HOSPITAL_COMMUNITY): Payer: Self-pay | Admitting: Oncology

## 2020-05-03 NOTE — Telephone Encounter (Signed)
I connected by phone with  Jennifer Knox  to discuss the potential use of an new treatment for mild to moderate COVID-19 viral infection in non-hospitalized patients.   This patient is a age/sex that meets the FDA criteria for Emergency Use Authorization of casirivimab\imdevimab.  Has a (+) direct SARS-CoV-2 viral test result 1. Has mild or moderate COVID-19  2. Is ? 31 years of age and weighs ? 40 kg 3. Is NOT hospitalized due to COVID-19 4. Is NOT requiring oxygen therapy or requiring an increase in baseline oxygen flow rate due to COVID-19 5. Is within 10 days of symptom onset 6. Has at least one of the high risk factor(s) for progression to severe COVID-19 and/or hospitalization as defined in EUA. ? Specific high risk criteria :epilepsy   Symptom onset  04/29/20   I have spoken and communicated the following to the patient or parent/caregiver:   1. FDA has authorized the emergency use of casirivimab\imdevimab for the treatment of mild to moderate COVID-19 in adults and pediatric patients with positive results of direct SARS-CoV-2 viral testing who are 43 years of age and older weighing at least 40 kg, and who are at high risk for progressing to severe COVID-19 and/or hospitalization.   2. The significant known and potential risks and benefits of casirivimab\imdevimab, and the extent to which such potential risks and benefits are unknown.   3. Information on available alternative treatments and the risks and benefits of those alternatives, including clinical trials.   4. Patients treated with casirivimab\imdevimab should continue to self-isolate and use infection control measures (e.g., wear mask, isolate, social distance, avoid sharing personal items, clean and disinfect "high touch" surfaces, and frequent handwashing) according to CDC guidelines.    5. The patient or parent/caregiver has the option to accept or refuse casirivimab\imdevimab .   After reviewing this information with the  patient, The patient agreed to proceed with receiving casirivimab\imdevimab infusion and will be provided a copy of the Fact sheet prior to receiving the infusion.Rulon Abide, AGNP-C [phone] ([place])

## 2020-06-22 ENCOUNTER — Ambulatory Visit: Payer: Self-pay | Admitting: Physician Assistant

## 2020-09-17 ENCOUNTER — Ambulatory Visit: Payer: Self-pay | Admitting: Physician Assistant

## 2020-10-13 ENCOUNTER — Ambulatory Visit: Payer: Self-pay | Admitting: Physician Assistant

## 2020-10-20 ENCOUNTER — Encounter: Payer: Self-pay | Admitting: Physician Assistant

## 2020-10-20 ENCOUNTER — Other Ambulatory Visit: Payer: Self-pay

## 2020-10-20 ENCOUNTER — Ambulatory Visit (INDEPENDENT_AMBULATORY_CARE_PROVIDER_SITE_OTHER): Payer: Self-pay | Admitting: Physician Assistant

## 2020-10-20 DIAGNOSIS — L821 Other seborrheic keratosis: Secondary | ICD-10-CM

## 2020-10-20 DIAGNOSIS — Z87898 Personal history of other specified conditions: Secondary | ICD-10-CM

## 2020-10-20 DIAGNOSIS — D229 Melanocytic nevi, unspecified: Secondary | ICD-10-CM

## 2020-10-20 DIAGNOSIS — Z1283 Encounter for screening for malignant neoplasm of skin: Secondary | ICD-10-CM

## 2020-10-20 DIAGNOSIS — Z86018 Personal history of other benign neoplasm: Secondary | ICD-10-CM

## 2020-10-20 DIAGNOSIS — L814 Other melanin hyperpigmentation: Secondary | ICD-10-CM

## 2020-10-20 DIAGNOSIS — D1800 Hemangioma unspecified site: Secondary | ICD-10-CM

## 2020-10-20 DIAGNOSIS — L578 Other skin changes due to chronic exposure to nonionizing radiation: Secondary | ICD-10-CM

## 2020-11-11 NOTE — Progress Notes (Signed)
   Follow-Up Visit   Subjective  Jennifer Knox is a 30 y.o. female who presents for the following: Follow-up (Follow up 3-6 months on her moderate atypical mole on the back in epic. No other concerns ).   The following portions of the chart were reviewed this encounter and updated as appropriate:  Tobacco  Allergies  Meds  Problems  Med Hx  Surg Hx  Fam Hx      Objective  Well appearing patient in no apparent distress; mood and affect are within normal limits.  A full examination was performed including scalp, head, eyes, ears, nose, lips, neck, chest, axillae, abdomen, back, buttocks, bilateral upper extremities, bilateral lower extremities, hands, feet, fingers, toes, fingernails, and toenails. All findings within normal limits unless otherwise noted below.  Objective  Left Lower Back: Dyspigmented scar.   Assessment & Plan  History of atypical nevus Left Lower Back  observe  Lentigines - Scattered tan macules - Discussed due to sun exposure - Benign, observe - Call for any changes  Seborrheic Keratoses - Stuck-on, waxy, tan-brown papules and plaques  - Discussed benign etiology and prognosis. - Observe - Call for any changes  Melanocytic Nevi - Tan-brown and/or pink-flesh-colored symmetric macules and papules - Benign appearing on exam today - Observation - Call clinic for new or changing moles - Recommend daily use of broad spectrum spf 30+ sunscreen to sun-exposed areas.   Hemangiomas - Red papules - Discussed benign nature - Observe - Call for any changes  Actinic Damage - diffuse scaly erythematous macules with underlying dyspigmentation - Recommend daily broad spectrum sunscreen SPF 30+ to sun-exposed areas, reapply every 2 hours as needed.  - Call for new or changing lesions.  Skin cancer screening performed today.    I, Ralene Gasparyan, PA-C, have reviewed all documentation's for this visit.  The documentation on 11/11/20 for the exam,  diagnosis, procedures and orders are all accurate and complete.

## 2021-12-01 ENCOUNTER — Ambulatory Visit: Payer: Self-pay | Admitting: Physician Assistant

## 2022-02-10 ENCOUNTER — Encounter: Payer: Self-pay | Admitting: Physician Assistant

## 2022-02-10 ENCOUNTER — Ambulatory Visit (INDEPENDENT_AMBULATORY_CARE_PROVIDER_SITE_OTHER): Payer: Self-pay | Admitting: Physician Assistant

## 2022-02-10 DIAGNOSIS — Z1283 Encounter for screening for malignant neoplasm of skin: Secondary | ICD-10-CM

## 2022-02-10 DIAGNOSIS — Z86018 Personal history of other benign neoplasm: Secondary | ICD-10-CM

## 2022-02-10 DIAGNOSIS — D225 Melanocytic nevi of trunk: Secondary | ICD-10-CM

## 2022-02-10 DIAGNOSIS — D2272 Melanocytic nevi of left lower limb, including hip: Secondary | ICD-10-CM

## 2022-02-10 DIAGNOSIS — Z840 Family history of diseases of the skin and subcutaneous tissue: Secondary | ICD-10-CM

## 2022-02-10 DIAGNOSIS — D485 Neoplasm of uncertain behavior of skin: Secondary | ICD-10-CM

## 2022-02-10 NOTE — Patient Instructions (Addendum)
Jennifer Knox  Biopsy, Surgery (Curettage) & Surgery (Excision) Aftercare Instructions  1. Okay to remove bandage in 24 hours  2. Wash area with soap and water  3. Apply Vaseline to area twice daily until healed (Not Neosporin)  4. Okay to cover with a Band-Aid to decrease the chance of infection or prevent irritation from clothing; also it's okay to uncover lesion at home.  5. Suture instructions: return to our office in 7-10 or 10-14 days for a nurse visit for suture removal. Variable healing with sutures, if pain or itching occurs call our office. It's okay to shower or bathe 24 hours after sutures are given.  6. The following risks may occur after a biopsy, curettage or excision: bleeding, scarring, discoloration, recurrence, infection (redness, yellow drainage, pain or swelling).  7. For questions, concerns and results call our office at [place] before 4pm & Friday before 3pm. Biopsy results will be available in 1 week.

## 2022-02-15 ENCOUNTER — Telehealth: Payer: Self-pay | Admitting: *Deleted

## 2022-02-15 NOTE — Telephone Encounter (Signed)
-----   Message from Jennifer Knox, [place] sent at 02/11/2022 12:41 PM EDT ----- Atypical. [name] margins continue yearly exams

## 2022-02-15 NOTE — Telephone Encounter (Signed)
Path to patient. Follow up with yearly exam.

## 2022-02-24 ENCOUNTER — Encounter: Payer: Self-pay | Admitting: Physician Assistant

## 2022-02-24 NOTE — Progress Notes (Signed)
   Follow-Up Visit   Subjective  Jennifer Knox is a 31 y.o. female who presents for the following: Annual Exam (Patient here today for yearly skin check, no concerns. Personal history and family history of atypical moles. No family history of melanoma or non mole skin cancer. ).   The following portions of the chart were reviewed this encounter and updated as appropriate:  Tobacco  Allergies  Meds  Problems  Med Hx  Surg Hx  Fam Hx      Objective  Well appearing patient in no apparent distress; mood and affect are within normal limits.  A full examination was performed including scalp, head, eyes, ears, nose, lips, neck, chest, axillae, abdomen, back, buttocks, bilateral upper extremities, bilateral lower extremities, hands, feet, fingers, toes, fingernails, and toenails. All findings within normal limits unless otherwise noted below.  Full body skin examination - No signs of NMSC noted at the time of the visit.   Left Thigh - Anterior Bichromic dark nested macule.        Mid Back Bichromic dark nested macule.             Assessment & Plan  Encounter for screening for malignant neoplasm of skin  Yearly skin examinations.  Neoplasm of uncertain behavior of skin (2) Left Thigh - Anterior  Skin / nail biopsy Type of biopsy: tangential   Informed consent: discussed and consent obtained   Timeout: patient name, date of birth, surgical site, and procedure verified   Procedure prep:  Patient was prepped and draped in usual sterile fashion (Non sterile) Prep type:  Chlorhexidine Anesthesia: the lesion was anesthetized in a standard fashion   Anesthetic:  1% lidocaine w/ epinephrine 1-100,000 local infiltration Instrument used: flexible razor blade   Outcome: patient tolerated procedure well   Post-procedure details: wound care instructions given    Specimen 1 - Surgical pathology Differential Diagnosis: r/o atypia  Check Margins: yes  Mid Back  Skin /  nail biopsy Type of biopsy: tangential   Informed consent: discussed and consent obtained   Timeout: patient name, date of birth, surgical site, and procedure verified   Procedure prep:  Patient was prepped and draped in usual sterile fashion (Non sterile) Prep type:  Chlorhexidine Anesthesia: the lesion was anesthetized in a standard fashion   Anesthetic:  1% lidocaine w/ epinephrine 1-100,000 local infiltration Instrument used: flexible razor blade   Outcome: patient tolerated procedure well   Post-procedure details: wound care instructions given    Specimen 2 - Surgical pathology Differential Diagnosis: r/o atypia  Check Margins: yes    I, Krystin Keeven, PA-C, have reviewed all documentation's for this visit.  The documentation on 02/24/22 for the exam, diagnosis, procedures and orders are all accurate and complete.

## 2023-08-11 ENCOUNTER — Inpatient Hospital Stay (HOSPITAL_COMMUNITY)
Admission: AD | Admit: 2023-08-11 | Discharge: 2023-08-11 | Disposition: A | Discharge status: Home / Self Care | Payer: Self-pay | Attending: Obstetrics & Gynecology | Admitting: Obstetrics & Gynecology

## 2023-08-11 ENCOUNTER — Encounter (HOSPITAL_COMMUNITY): Payer: Self-pay | Admitting: Obstetrics & Gynecology

## 2023-08-11 ENCOUNTER — Inpatient Hospital Stay (HOSPITAL_COMMUNITY): Payer: Self-pay

## 2023-08-11 DIAGNOSIS — Z6791 Unspecified blood type, Rh negative: Secondary | ICD-10-CM | POA: Insufficient documentation

## 2023-08-11 DIAGNOSIS — O2621 Pregnancy care for patient with recurrent pregnancy loss, first trimester: Secondary | ICD-10-CM | POA: Insufficient documentation

## 2023-08-11 DIAGNOSIS — O26891 Other specified pregnancy related conditions, first trimester: Secondary | ICD-10-CM | POA: Insufficient documentation

## 2023-08-11 DIAGNOSIS — Z3A01 Less than 8 weeks gestation of pregnancy: Secondary | ICD-10-CM | POA: Insufficient documentation

## 2023-08-11 DIAGNOSIS — O209 Hemorrhage in early pregnancy, unspecified: Secondary | ICD-10-CM | POA: Insufficient documentation

## 2023-08-11 DIAGNOSIS — O09291 Supervision of pregnancy with other poor reproductive or obstetric history, first trimester: Secondary | ICD-10-CM | POA: Insufficient documentation

## 2023-08-11 DIAGNOSIS — O3680X0 Pregnancy with inconclusive fetal viability, not applicable or unspecified: Secondary | ICD-10-CM | POA: Insufficient documentation

## 2023-08-11 DIAGNOSIS — O26899 Other specified pregnancy related conditions, unspecified trimester: Secondary | ICD-10-CM

## 2023-08-11 DIAGNOSIS — Z23 Encounter for immunization: Secondary | ICD-10-CM | POA: Insufficient documentation

## 2023-08-11 LAB — URINALYSIS, ROUTINE W REFLEX MICROSCOPIC
Bilirubin Urine: NEGATIVE
Glucose, UA: NEGATIVE mg/dL
Ketones, ur: NEGATIVE mg/dL
Nitrite: NEGATIVE
Protein, ur: NEGATIVE mg/dL
RBC / HPF: >50 RBC/hpf (ref 0–5)
Specific Gravity, Urine: 1.02 (ref 1.005–1.030)
pH: 5 (ref 5.0–8.0)

## 2023-08-11 LAB — COMPREHENSIVE METABOLIC PANEL
ALT: 15 U/L (ref 0–44)
AST: 20 U/L (ref 15–41)
Albumin: 3.9 g/dL (ref 3.5–5.0)
Alkaline Phosphatase: 32 U/L — ABNORMAL LOW (ref 38–126)
Anion gap: 7 (ref 5–15)
BUN: 9 mg/dL (ref 6–20)
CO2: 26 mmol/L (ref 22–32)
Calcium: 9.1 mg/dL (ref 8.9–10.3)
Chloride: 104 mmol/L (ref 98–111)
Creatinine, Ser: 0.74 mg/dL (ref 0.44–1.00)
GFR, Estimated: >60 mL/min (ref >60)
Glucose, Bld: 87 mg/dL (ref 70–99)
Potassium: 3.9 mmol/L (ref 3.5–5.1)
Sodium: 137 mmol/L (ref 135–145)
Total Bilirubin: 0.4 mg/dL (ref 0.0–1.2)
Total Protein: 6.7 g/dL (ref 6.5–8.1)

## 2023-08-11 LAB — CBC
HCT: 38.7 % (ref 36.0–46.0)
Hemoglobin: 13.2 g/dL (ref 12.0–15.0)
MCH: 30.6 pg (ref 26.0–34.0)
MCHC: 34.1 g/dL (ref 30.0–36.0)
MCV: 89.8 fL (ref 80.0–100.0)
Platelets: 201 10*3/uL (ref 150–400)
RBC: 4.31 MIL/uL (ref 3.87–5.11)
RDW: 12.6 % (ref 11.5–15.5)
WBC: 3.9 10*3/uL — ABNORMAL LOW (ref 4.0–10.5)
nRBC: 0 % (ref 0.0–0.2)

## 2023-08-11 LAB — ABO/RH
ABO/RH(D): O NEG
Antibody Screen: NEGATIVE

## 2023-08-11 LAB — WET PREP, GENITAL
Clue Cells Wet Prep HPF POC: NONE SEEN
Sperm: NONE SEEN
Trich, Wet Prep: NONE SEEN
WBC, Wet Prep HPF POC: <10 (ref <10)
Yeast Wet Prep HPF POC: NONE SEEN

## 2023-08-11 LAB — POCT PREGNANCY, URINE: Preg Test, Ur: POSITIVE — AB

## 2023-08-11 LAB — HCG, QUANTITATIVE, PREGNANCY: hCG, Beta Chain, Quant, S: 65 m[IU]/mL — ABNORMAL HIGH (ref <5)

## 2023-08-11 MED ORDER — RHO D IMMUNE GLOBULIN 1500 UNIT/2ML IJ SOSY
300.0000 ug | PREFILLED_SYRINGE | Freq: Once | INTRAMUSCULAR | Status: AC
  Administered 2023-08-11: 300 ug via INTRAMUSCULAR
  Filled 2023-08-11: qty 2

## 2023-08-11 NOTE — MAU Note (Signed)
.  Aron Needles is a 33 y.o. at Unknown here in MAU reporting: Vaginal bleeding that initially began as spotting on Wednesday. She reports yesterday her bleeding increased and she passed several blood clots with the largest being the size of a quarter. She reports today her bleeding has decreased again. She reports occasional lower abdominal cramps that she rates a 2/10. Hx of two SAB's so the patient is concerned about a miscarriage.  Coming from home. Has not been seen in the office yet.   LMP: 07/09/2023 Onset of complaint: Wednesday this week Pain score: 2/10 lower abdomen  Vitals:   08/11/23 1008  BP: 107/74  Pulse: 78  Resp: 16  Temp: 98.2 F (36.8 C)  SpO2: 100%      FHT: n/a  Lab orders placed from triage: POCT Pregnancy

## 2023-08-11 NOTE — MAU Provider Note (Signed)
Chief Complaint: Vaginal Bleeding   Event Date/Time   First Provider Initiated Contact with Patient 08/11/23 1023      SUBJECTIVE HPI: Jennifer Knox is a 33 y.o. [id number] at [redacted]w[redacted]d by LMP who presents to maternity admissions reporting vaginal bleeding and cramping.  Patient presents with 2 positive home pregnancy tests earlier this week. Started to have vaginal bleeding 2 days ago. Feels like a period with one clot. Has lightened today. Very mild cramping 2/10. She denies fever/chills, urinary symptoms, change in discharge. 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  HPI  Past Medical History:  Diagnosis Date   Anemia    GERD (gastroesophageal reflux disease)    Seizures (HCC) 2006   No seizures since 2008, triggers:lack if sleep, no meds since 2012   SVD (spontaneous vaginal delivery) 08/2013   x 1 [place]   Past Surgical History:  Procedure Laterality Date   DILATION AND EVACUATION N/A 02/17/2018   Procedure: DILATATION AND EVACUATION;  Surgeon: Essie Hart, MD;  Location: [facility];  Service: Gynecology;  Laterality: N/A;   LAPAROSCOPY N/A 07/01/2014   Procedure: LAPAROSCOPY OPERATIVE With paratubal cystectomy;  Surgeon: Hal Morales, MD;  Location: [facility];  Service: Gynecology;  Laterality: N/A;   WISDOM TOOTH EXTRACTION     Social History   Socioeconomic History   Marital status: Married    Spouse name: Not on file   Number of children: Not on file   Years of education: Not on file   Highest education level: Not on file  Occupational History   Not on file  Tobacco Use   Smoking status: Never   Smokeless tobacco: Never  Vaping Use   Vaping status: Never Used  Substance and Sexual Activity   Alcohol use: No   Drug use: No   Sexual activity: Yes  Other Topics Concern   Not on file  Social History Narrative   Not on file   Social Drivers of Health   Financial Resource Strain: Not on file  Food Insecurity: Not on file   Transportation Needs: Not on file  Physical Activity: Not on file  Stress: Not on file  Social Connections: Not on file  Intimate Partner Violence: Not on file   No current facility-administered medications on file prior to encounter.   Current Outpatient Medications on File Prior to Encounter  Medication Sig Dispense Refill   Prenatal Vit-Fe Fumarate-FA (PRENATAL MULTIVITAMIN) TABS tablet Take 1 tablet by mouth daily at 12 noon.     Allergies  Allergen Reactions   Penicillins Hives    Has patient had a PCN reaction causing immediate rash, facial/tongue/throat swelling, SOB or lightheadedness with hypotension: unknown Has patient had a PCN reaction causing severe rash involving mucus membranes or skin necrosis: unknown Has patient had a PCN reaction that required hospitalization no Has patient had a PCN reaction occurring within the last 10 years: no If all of the above answers are "NO", then may proceed with Cephalosporin use.     ROS:  Pertinent positives/negatives listed above.  I have reviewed patient's Past Medical Hx, Surgical Hx, Family Hx, Social Hx, medications and allergies.   Physical Exam  Patient Vitals for the past 24 hrs:  BP Temp Temp src Pulse Resp SpO2 Height Weight  08/11/23 1008 107/74 98.2 F (36.8 C) Oral 78 16 100 % -- --  08/11/23 1004 -- -- -- -- -- -- 5\' 7"  (1.702 m) 53.9 kg   Constitutional:  Well-developed, well-nourished female in no acute distress Cardiovascular: normal rate Respiratory: normal effort GI: Abd soft, non-tender. Pos BS x 4 MS: Extremities nontender, no edema, normal ROM Neurologic: Alert and oriented x 4  GU: Neg CVAT  LAB RESULTS Results for orders placed or performed during the hospital encounter of 08/11/23 (from the past 24 hours)  Pregnancy, urine POC     Status: Abnormal   Collection Time: 08/11/23 10:03 AM  Result Value Ref Range   Preg Test, Ur POSITIVE (A) NEGATIVE  Urinalysis, Routine w reflex microscopic -Urine,  Clean Catch     Status: Abnormal   Collection Time: 08/11/23 10:29 AM  Result Value Ref Range   Color, Urine YELLOW YELLOW   APPearance HAZY (A) CLEAR   Specific Gravity, Urine 1.020 1.005 - 1.030   pH 5.0 5.0 - 8.0   Glucose, UA NEGATIVE NEGATIVE mg/dL   Hgb urine dipstick LARGE (A) NEGATIVE   Bilirubin Urine NEGATIVE NEGATIVE   Ketones, ur NEGATIVE NEGATIVE mg/dL   Protein, ur NEGATIVE NEGATIVE mg/dL   Nitrite NEGATIVE NEGATIVE   Leukocytes,Ua MODERATE (A) NEGATIVE   RBC / HPF >50 0 - 5 RBC/hpf   WBC, UA 21-50 0 - 5 WBC/hpf   Bacteria, UA RARE (A) NONE SEEN   Squamous Epithelial / HPF 0-5 0 - 5 /HPF   Mucus PRESENT   Wet prep, genital     Status: None   Collection Time: 08/11/23 10:29 AM  Result Value Ref Range   Yeast Wet Prep HPF POC NONE SEEN NONE SEEN   Trich, Wet Prep NONE SEEN NONE SEEN   Clue Cells Wet Prep HPF POC NONE SEEN NONE SEEN   WBC, Wet Prep HPF POC <10 <10   Sperm NONE SEEN   ABO/Rh     Status: None   Collection Time: 08/11/23 10:37 AM  Result Value Ref Range   ABO/RH(D) O NEG    Antibody Screen      NEG Performed at [facility], [address] [place], [place] [address]   CBC     Status: Abnormal   Collection Time: 08/11/23 10:39 AM  Result Value Ref Range   WBC 3.9 (L) 4.0 - 10.5 K/uL   RBC 4.31 3.87 - 5.11 MIL/uL   Hemoglobin 13.2 12.0 - 15.0 g/dL   HCT 60.4 54.0 - 98.1 %   MCV 89.8 80.0 - 100.0 fL   MCH 30.6 26.0 - 34.0 pg   MCHC 34.1 30.0 - 36.0 g/dL   RDW 19.1 47.8 - 29.5 %   Platelets 201 150 - 400 K/uL   nRBC 0.0 0.0 - 0.2 %  Comprehensive metabolic panel     Status: Abnormal   Collection Time: 08/11/23 10:39 AM  Result Value Ref Range   Sodium 137 135 - 145 mmol/L   Potassium 3.9 3.5 - 5.1 mmol/L   Chloride 104 98 - 111 mmol/L   CO2 26 22 - 32 mmol/L   Glucose, Bld 87 70 - 99 mg/dL   BUN 9 6 - 20 mg/dL   Creatinine, Ser 6.21 0.44 - 1.00 mg/dL   Calcium 9.1 8.9 - 30.8 mg/dL   Total Protein 6.7 6.5 - 8.1 g/dL   Albumin 3.9  3.5 - 5.0 g/dL   AST 20 15 - 41 U/L   ALT 15 0 - 44 U/L   Alkaline Phosphatase 32 (L) 38 - 126 U/L   Total Bilirubin 0.4 0.0 - 1.2 mg/dL   GFR, Estimated >65 >78  mL/min   Anion gap 7 5 - 15  hCG, quantitative, pregnancy     Status: Abnormal   Collection Time: 08/11/23 10:39 AM  Result Value Ref Range   hCG, Beta Chain, Quant, S 65 (H) <5 mIU/mL  Rh IG workup (includes ABO/Rh)     Status: None (Preliminary result)   Collection Time: 08/11/23 11:28 AM  Result Value Ref Range   Gestational Age(Wks) 4    Unit Number [id number]    Blood Component Type RHIG    Unit division 00    Status of Unit ISSUED    Transfusion Status      OK TO TRANSFUSE Performed at [facility], [address] [place], [place] [address]     --/--/O NEG (01/24 1037)  IMAGING US OB LESS THAN 14 WEEKS WITH OB TRANSVAGINAL Result Date: 08/11/2023 CLINICAL DATA:  [id number] Vaginal bleeding affecting early pregnancy [id number] EXAM: OBSTETRIC <14 WK [place] AND TRANSVAGINAL OB US TECHNIQUE: Both transabdominal and transvaginal ultrasound examinations were performed for complete evaluation of the gestation as well as the maternal uterus, adnexal regions, and pelvic cul-de-sac. Transvaginal technique was performed to assess early pregnancy. COMPARISON:  None Available. FINDINGS: Intrauterine gestational sac: None Maternal uterus/adnexae: Normal-sized retroverted uterus. Normal endometrium. No focal mass. Unremarkable bilateral ovaries. No suspicious adnexal lesion seen on the provided images. IMPRESSION: *No intrauterine gestation is seen and no adnexal mass is evident. These findings represent a pregnancy of unknown location. The sonographic differential diagnosis includes: an intrauterine gestation too early to visualize, a spontaneous abortion, or an occult ectopic pregnancy. Continued close clinical follow-up, serial serum beta-HCG levels and short term sonographic follow up in 7-14 days, or earlier if clinically  indicated, is recommended. Electronically Signed   By: Jules Schick M.D.   On: 08/11/2023 11:31    MAU Management/MDM: Orders Placed This Encounter  Procedures   Wet prep, genital   Culture, OB Urine   US OB LESS THAN 14 WEEKS WITH OB TRANSVAGINAL   CBC   Comprehensive metabolic panel   hCG, quantitative, pregnancy   Urinalysis, Routine w reflex microscopic -Urine, Clean Catch   Pregnancy, urine POC   ABO/Rh   Rh IG workup (includes ABO/Rh)   Discharge patient    Meds ordered this encounter  Medications   rho (d) immune globulin (RHIG/RHOPHYLAC) injection 300 mcg    Patient ~[redacted] weeks pregnant by certain LMP with vaginal bleeding and cramping. No signs of instability at this time. Will proceed with ectopic work-up. Given this early of pregnancy, discussed that we will likely not see much on ultrasound and will likely need follow-up labs/ultrasound pending results. Patient denies any need for pain relief at this time.  Rhogam given for Rh neg status and vaginal bleeding.  Discussed results with patient. She does have a pregnancy of unknown location at this time. I do suspect she had an early miscarriage however based upon HCG of 65 and bleeding that is lightening. She will need to return for repeat HCG in 48 hours. She also could repeat in regular OB office on Monday. She will call the office today to see if they can get her in for a lab appointment and decide. PUL precautions discussed.  Does have moderate leuks on UA but denies any urinary symptoms. Sent UA for culture and will treat pending culture results.  ASSESSMENT 1. Pregnancy of unknown anatomic location   2. Vaginal bleeding affecting early pregnancy   3. Rh negative state in antepartum period  4. [redacted] weeks gestation of pregnancy     PLAN Discharge home with strict return precautions. Allergies as of 08/11/2023       Reactions   Penicillins Hives   Has patient had a PCN reaction causing immediate rash,  facial/tongue/throat swelling, SOB or lightheadedness with hypotension: unknown Has patient had a PCN reaction causing severe rash involving mucus membranes or skin necrosis: unknown Has patient had a PCN reaction that required hospitalization no Has patient had a PCN reaction occurring within the last 10 years: no If all of the above answers are "NO", then may proceed with Cephalosporin use.        Medication List     TAKE these medications    prenatal multivitamin Tabs tablet Take 1 tablet by mouth daily at 12 noon.         Wylene Simmer, MD OB Fellow 08/11/2023  12:24 PM

## 2023-08-12 LAB — RH IG WORKUP (INCLUDES ABO/RH)
Gestational Age(Wks): 4
Unit division: 0

## 2023-08-13 ENCOUNTER — Inpatient Hospital Stay (HOSPITAL_COMMUNITY)
Admission: AD | Admit: 2023-08-13 | Discharge: 2023-08-13 | Disposition: A | Discharge status: Home / Self Care | Payer: Self-pay | Attending: Obstetrics and Gynecology | Admitting: Obstetrics and Gynecology

## 2023-08-13 ENCOUNTER — Encounter: Payer: Self-pay | Admitting: Family Medicine

## 2023-08-13 ENCOUNTER — Other Ambulatory Visit: Payer: Self-pay

## 2023-08-13 DIAGNOSIS — O209 Hemorrhage in early pregnancy, unspecified: Secondary | ICD-10-CM

## 2023-08-13 DIAGNOSIS — O26851 Spotting complicating pregnancy, first trimester: Secondary | ICD-10-CM | POA: Insufficient documentation

## 2023-08-13 DIAGNOSIS — Z6791 Unspecified blood type, Rh negative: Secondary | ICD-10-CM | POA: Insufficient documentation

## 2023-08-13 DIAGNOSIS — O200 Threatened abortion: Secondary | ICD-10-CM | POA: Insufficient documentation

## 2023-08-13 DIAGNOSIS — Z3A01 Less than 8 weeks gestation of pregnancy: Secondary | ICD-10-CM | POA: Insufficient documentation

## 2023-08-13 LAB — CULTURE, OB URINE

## 2023-08-13 LAB — GC/CHLAMYDIA PROBE AMP (~~LOC~~) NOT AT ARMC
Chlamydia: NEGATIVE
Comment: NEGATIVE
Comment: NORMAL
Neisseria Gonorrhea: NEGATIVE

## 2023-08-13 LAB — HCG, QUANTITATIVE, PREGNANCY: hCG, Beta Chain, Quant, S: 15 m[IU]/mL — ABNORMAL HIGH (ref <5)

## 2023-08-13 NOTE — MAU Note (Signed)
Jennifer Knox is a 33 y.o. at [redacted]w[redacted]d here in MAU reporting: she's here for follow up HCG level.  Denies pain, reports light VB.  LMP: 07/09/2023 Onset of complaint: ongoing Pain score: 0 Vitals:   08/13/23 1426  BP: 105/66  Pulse: 94  Resp: 18  Temp: 98 F (36.7 C)  SpO2: 100%     FHT: NA  Lab orders placed from triage: None

## 2023-08-13 NOTE — MAU Provider Note (Addendum)
History     CSN: [id number]  Arrival date and time: 08/13/23 1403    Chief Complaint  Patient presents with   Vaginal Bleeding   Vaginal Bleeding Pertinent negatives include no abdominal pain, back pain, chills, diarrhea, dysuria, fever, flank pain, nausea, rash, sore throat or vomiting.   Patient is 33 y.o. [id number] [redacted]w[redacted]d here after recent visit 48 hrs prior. She was seen for vaginal bleeding at that time and diagnosed with a likely pregnancy loss. Since her last MAU visit the bleeding had decreased and she reports minimal cramping. She is here today to get a repeat BHCG.   OB History     Gravida  6   Para  3   Term  3   Preterm      AB  2   Living  3      SAB  2   IAB      Ectopic      Multiple  0   Live Births  3           Past Medical History:  Diagnosis Date   Anemia    GERD (gastroesophageal reflux disease)    Seizures (HCC) 2006   No seizures since 2008, triggers:lack if sleep, no meds since 2012   SVD (spontaneous vaginal delivery) 08/2013   x 1 [place]    Past Surgical History:  Procedure Laterality Date   DILATION AND EVACUATION N/A 02/17/2018   Procedure: DILATATION AND EVACUATION;  Surgeon: Essie Hart, MD;  Location: [facility];  Service: Gynecology;  Laterality: N/A;   LAPAROSCOPY N/A 07/01/2014   Procedure: LAPAROSCOPY OPERATIVE With paratubal cystectomy;  Surgeon: Hal Morales, MD;  Location: [facility];  Service: Gynecology;  Laterality: N/A;   WISDOM TOOTH EXTRACTION      No family history on file.  Social History   Tobacco Use   Smoking status: Never   Smokeless tobacco: Never  Vaping Use   Vaping status: Never Used  Substance Use Topics   Alcohol use: No   Drug use: No    Allergies:  Allergies  Allergen Reactions   Penicillins Hives    Has patient had a PCN reaction causing immediate rash, facial/tongue/throat swelling, SOB or lightheadedness with hypotension: unknown Has patient had a PCN reaction causing severe  rash involving mucus membranes or skin necrosis: unknown Has patient had a PCN reaction that required hospitalization no Has patient had a PCN reaction occurring within the last 10 years: no If all of the above answers are "NO", then may proceed with Cephalosporin use.     Medications Prior to Admission  Medication Sig Dispense Refill Last Dose/Taking   Prenatal Vit-Fe Fumarate-FA (PRENATAL MULTIVITAMIN) TABS tablet Take 1 tablet by mouth daily at 12 noon.       Review of Systems  Constitutional:  Negative for chills and fever.  HENT:  Negative for congestion and sore throat.   Eyes:  Negative for pain and visual disturbance.  Respiratory:  Negative for cough, chest tightness and shortness of breath.   Cardiovascular:  Negative for chest pain.  Gastrointestinal:  Negative for abdominal pain, diarrhea, nausea and vomiting.  Endocrine: Negative for cold intolerance and heat intolerance.  Genitourinary:  Positive for vaginal bleeding. Negative for dysuria and flank pain.  Musculoskeletal:  Negative for back pain.  Skin:  Negative for rash.  Allergic/Immunologic: Negative for food allergies.  Neurological:  Negative for dizziness and light-headedness.  Psychiatric/Behavioral:  Negative for agitation.    Physical Exam  Blood pressure 105/66, pulse 94, temperature 98 F (36.7 C), temperature source Oral, resp. rate 18, height 5\' 7"  (1.702 m), weight 53.8 kg, last menstrual period 07/09/2023, SpO2 100%, unknown if currently breastfeeding.  Physical Exam Vitals and nursing note reviewed.  Constitutional:      General: She is not in acute distress.    Appearance: She is well-developed.     Comments: Thin   HENT:     Head: Normocephalic and atraumatic.  Eyes:     General: No scleral icterus.    Conjunctiva/sclera: Conjunctivae normal.  Cardiovascular:     Rate and Rhythm: Normal rate.  Pulmonary:     Effort: Pulmonary effort is normal.  Chest:     Chest wall: No tenderness.   Abdominal:     Palpations: Abdomen is soft.     Tenderness: There is no abdominal tenderness. There is no guarding or rebound.  Genitourinary:    Vagina: Normal.  Musculoskeletal:        General: Normal range of motion.     Cervical back: Normal range of motion and neck supple.  Skin:    General: Skin is warm and dry.     Findings: No rash.  Neurological:     Mental Status: She is alert and oriented to person, place, and time.     MAU Course  Procedures  MDM- moderate  Reviewed visit from 1/24 Patient with previously normal labs s/f BHCG =65  US showed no IUP Patient received rhogam on 1/24  Repeat BHCG draw today  Assessment and Plan   1. Vaginal bleeding in pregnancy, first trimester   2. Threatened abortion   3. Rh negative state in antepartum period     - Reviewed most likely level will be decreasing consistent with pregnancy loss - If stable or not appropriately rising the risk of ectopic continues - If doubling we discussed that this is consistent with normal pregnancy - Patient opted to leave MAU and be inform of results via MyChart - Aware of return precautions - Discussed repeat in at least 1 week unless result today is negative  [name] 08/13/2023, 3:09 PM

## 2023-08-13 NOTE — Discharge Instructions (Signed)
You were seen to check your pregnancy hormone.   Your blood was drawn and the results should be back in ~2 hours. Your results will come into MyChart directly  As we discussed your symptoms and hormone level on 1/24 are likely consistent with a miscarriage. We will know more once your pregnancy hormone results today.   We made an appointment for you in 1 week to check the pregnancy hormone again.

## 2023-08-22 ENCOUNTER — Other Ambulatory Visit: Payer: Self-pay

## 2023-08-22 ENCOUNTER — Ambulatory Visit: Payer: Self-pay

## 2023-08-22 DIAGNOSIS — O039 Complete or unspecified spontaneous abortion without complication: Secondary | ICD-10-CM

## 2023-08-22 DIAGNOSIS — O3680X0 Pregnancy with inconclusive fetal viability, not applicable or unspecified: Secondary | ICD-10-CM

## 2023-08-23 ENCOUNTER — Encounter: Payer: Self-pay | Admitting: Family Medicine

## 2023-08-23 LAB — BETA HCG QUANT (REF LAB): hCG Quant: <1 m[IU]/mL
